# Patient Record
Sex: Female | Born: 1965 | Race: White | Hispanic: No | Marital: Married | Smoking: Never smoker
Health system: Southern US, Community
[De-identification: ages and names within clinical notes are randomized; demographics above are authoritative.]

## PROBLEM LIST (undated history)

## (undated) DIAGNOSIS — C439 Malignant melanoma of skin, unspecified: Secondary | ICD-10-CM

## (undated) DIAGNOSIS — N200 Calculus of kidney: Secondary | ICD-10-CM

## (undated) DIAGNOSIS — E785 Hyperlipidemia, unspecified: Secondary | ICD-10-CM

## (undated) DIAGNOSIS — Z87442 Personal history of urinary calculi: Secondary | ICD-10-CM

## (undated) DIAGNOSIS — F32A Depression, unspecified: Secondary | ICD-10-CM

## (undated) DIAGNOSIS — I10 Essential (primary) hypertension: Secondary | ICD-10-CM

## (undated) DIAGNOSIS — K760 Fatty (change of) liver, not elsewhere classified: Secondary | ICD-10-CM

## (undated) DIAGNOSIS — G4730 Sleep apnea, unspecified: Secondary | ICD-10-CM

## (undated) DIAGNOSIS — K219 Gastro-esophageal reflux disease without esophagitis: Secondary | ICD-10-CM

## (undated) DIAGNOSIS — N201 Calculus of ureter: Secondary | ICD-10-CM

## (undated) DIAGNOSIS — T7840XA Allergy, unspecified, initial encounter: Secondary | ICD-10-CM

## (undated) DIAGNOSIS — J189 Pneumonia, unspecified organism: Secondary | ICD-10-CM

## (undated) DIAGNOSIS — J45909 Unspecified asthma, uncomplicated: Secondary | ICD-10-CM

## (undated) DIAGNOSIS — F329 Major depressive disorder, single episode, unspecified: Secondary | ICD-10-CM

## (undated) HISTORY — PX: OTHER SURGICAL HISTORY: SHX169

## (undated) HISTORY — DX: Calculus of ureter: N20.1

## (undated) HISTORY — DX: Gastro-esophageal reflux disease without esophagitis: K21.9

## (undated) HISTORY — DX: Hyperlipidemia, unspecified: E78.5

## (undated) HISTORY — DX: Allergy, unspecified, initial encounter: T78.40XA

## (undated) HISTORY — DX: Fatty (change of) liver, not elsewhere classified: K76.0

## (undated) HISTORY — DX: Depression, unspecified: F32.A

## (undated) HISTORY — PX: DILATION AND CURETTAGE OF UTERUS: SHX78

## (undated) HISTORY — PX: TUBAL LIGATION: SHX77

## (undated) HISTORY — PX: ESOPHAGOGASTRODUODENOSCOPY: SHX1529

## (undated) HISTORY — DX: Malignant melanoma of skin, unspecified: C43.9

## (undated) HISTORY — DX: Essential (primary) hypertension: I10

## (undated) HISTORY — DX: Calculus of kidney: N20.0

## (undated) HISTORY — DX: Major depressive disorder, single episode, unspecified: F32.9

## (undated) HISTORY — PX: COLONOSCOPY: SHX174

## (undated) HISTORY — DX: Sleep apnea, unspecified: G47.30

## (undated) HISTORY — PX: ENDOMETRIAL ABLATION W/ NOVASURE: SUR434

---

## 1998-08-31 ENCOUNTER — Other Ambulatory Visit: Admission: RE | Admit: 1998-08-31 | Discharge: 1998-08-31 | Payer: Self-pay | Admitting: Obstetrics and Gynecology

## 1998-08-31 ENCOUNTER — Encounter (INDEPENDENT_AMBULATORY_CARE_PROVIDER_SITE_OTHER): Payer: Self-pay | Admitting: Specialist

## 1998-12-15 ENCOUNTER — Other Ambulatory Visit: Admission: RE | Admit: 1998-12-15 | Discharge: 1998-12-15 | Payer: Self-pay | Admitting: Obstetrics and Gynecology

## 1999-03-11 ENCOUNTER — Other Ambulatory Visit: Admission: RE | Admit: 1999-03-11 | Discharge: 1999-03-11 | Payer: Self-pay | Admitting: Obstetrics and Gynecology

## 1999-07-12 ENCOUNTER — Other Ambulatory Visit: Admission: RE | Admit: 1999-07-12 | Discharge: 1999-07-12 | Payer: Self-pay | Admitting: Obstetrics and Gynecology

## 2000-01-12 ENCOUNTER — Other Ambulatory Visit: Admission: RE | Admit: 2000-01-12 | Discharge: 2000-01-12 | Payer: Self-pay | Admitting: Obstetrics and Gynecology

## 2000-08-03 ENCOUNTER — Other Ambulatory Visit: Admission: RE | Admit: 2000-08-03 | Discharge: 2000-08-03 | Payer: Self-pay | Admitting: Obstetrics and Gynecology

## 2000-08-15 ENCOUNTER — Encounter: Admission: RE | Admit: 2000-08-15 | Discharge: 2000-08-15 | Payer: Self-pay | Admitting: Obstetrics and Gynecology

## 2000-08-15 ENCOUNTER — Encounter: Payer: Self-pay | Admitting: Obstetrics and Gynecology

## 2001-08-07 ENCOUNTER — Other Ambulatory Visit: Admission: RE | Admit: 2001-08-07 | Discharge: 2001-08-07 | Payer: Self-pay | Admitting: Obstetrics and Gynecology

## 2001-08-28 ENCOUNTER — Encounter: Payer: Self-pay | Admitting: Obstetrics and Gynecology

## 2001-08-28 ENCOUNTER — Encounter: Admission: RE | Admit: 2001-08-28 | Discharge: 2001-08-28 | Payer: Self-pay | Admitting: Obstetrics and Gynecology

## 2002-09-02 ENCOUNTER — Encounter: Payer: Self-pay | Admitting: Obstetrics and Gynecology

## 2002-09-02 ENCOUNTER — Encounter: Admission: RE | Admit: 2002-09-02 | Discharge: 2002-09-02 | Payer: Self-pay | Admitting: Obstetrics and Gynecology

## 2003-08-13 ENCOUNTER — Ambulatory Visit (HOSPITAL_COMMUNITY): Admission: RE | Admit: 2003-08-13 | Discharge: 2003-08-13 | Payer: Self-pay | Admitting: Obstetrics and Gynecology

## 2003-08-13 ENCOUNTER — Encounter (INDEPENDENT_AMBULATORY_CARE_PROVIDER_SITE_OTHER): Payer: Self-pay | Admitting: *Deleted

## 2003-10-29 ENCOUNTER — Encounter: Admission: RE | Admit: 2003-10-29 | Discharge: 2003-10-29 | Payer: Self-pay | Admitting: Obstetrics and Gynecology

## 2005-01-16 HISTORY — PX: LAPAROSCOPIC HYSTERECTOMY: SHX1926

## 2005-05-24 ENCOUNTER — Ambulatory Visit (HOSPITAL_COMMUNITY): Admission: RE | Admit: 2005-05-24 | Discharge: 2005-05-24 | Payer: Self-pay | Admitting: Obstetrics and Gynecology

## 2006-05-28 ENCOUNTER — Encounter: Admission: RE | Admit: 2006-05-28 | Discharge: 2006-05-28 | Payer: Self-pay | Admitting: Obstetrics and Gynecology

## 2006-08-09 ENCOUNTER — Ambulatory Visit (HOSPITAL_BASED_OUTPATIENT_CLINIC_OR_DEPARTMENT_OTHER): Admission: RE | Admit: 2006-08-09 | Discharge: 2006-08-09 | Payer: Self-pay | Admitting: Urology

## 2007-04-04 ENCOUNTER — Ambulatory Visit: Payer: Self-pay | Admitting: Nurse Practitioner

## 2007-05-17 ENCOUNTER — Ambulatory Visit: Payer: Self-pay | Admitting: Nurse Practitioner

## 2007-05-24 ENCOUNTER — Ambulatory Visit (HOSPITAL_COMMUNITY): Admission: RE | Admit: 2007-05-24 | Discharge: 2007-05-25 | Payer: Self-pay | Admitting: Obstetrics and Gynecology

## 2007-05-24 ENCOUNTER — Encounter (INDEPENDENT_AMBULATORY_CARE_PROVIDER_SITE_OTHER): Payer: Self-pay | Admitting: Obstetrics and Gynecology

## 2007-05-30 ENCOUNTER — Inpatient Hospital Stay (HOSPITAL_COMMUNITY): Admission: AD | Admit: 2007-05-30 | Discharge: 2007-05-30 | Payer: Self-pay | Admitting: Obstetrics

## 2007-06-06 ENCOUNTER — Encounter: Admission: RE | Admit: 2007-06-06 | Discharge: 2007-06-06 | Payer: Self-pay | Admitting: Obstetrics and Gynecology

## 2007-06-13 ENCOUNTER — Encounter: Admission: RE | Admit: 2007-06-13 | Discharge: 2007-06-13 | Payer: Self-pay | Admitting: Obstetrics and Gynecology

## 2007-06-17 HISTORY — PX: BREAST BIOPSY: SHX20

## 2007-11-28 ENCOUNTER — Encounter: Admission: RE | Admit: 2007-11-28 | Discharge: 2007-11-28 | Payer: Self-pay | Admitting: Obstetrics and Gynecology

## 2008-06-08 ENCOUNTER — Encounter: Admission: RE | Admit: 2008-06-08 | Discharge: 2008-06-08 | Payer: Self-pay | Admitting: Obstetrics and Gynecology

## 2009-05-03 ENCOUNTER — Emergency Department (HOSPITAL_COMMUNITY): Admission: EM | Admit: 2009-05-03 | Discharge: 2009-05-03 | Payer: Self-pay | Admitting: Emergency Medicine

## 2009-05-03 DIAGNOSIS — N201 Calculus of ureter: Secondary | ICD-10-CM

## 2009-05-03 DIAGNOSIS — K760 Fatty (change of) liver, not elsewhere classified: Secondary | ICD-10-CM

## 2009-05-03 HISTORY — DX: Fatty (change of) liver, not elsewhere classified: K76.0

## 2009-05-03 HISTORY — DX: Calculus of ureter: N20.1

## 2009-06-11 ENCOUNTER — Encounter: Admission: RE | Admit: 2009-06-11 | Discharge: 2009-06-11 | Payer: Self-pay | Admitting: Obstetrics and Gynecology

## 2010-02-06 ENCOUNTER — Encounter: Payer: Self-pay | Admitting: Obstetrics and Gynecology

## 2010-04-05 LAB — URINALYSIS, ROUTINE W REFLEX MICROSCOPIC
Bilirubin Urine: NEGATIVE
Glucose, UA: NEGATIVE mg/dL
Ketones, ur: NEGATIVE mg/dL
Leukocytes, UA: NEGATIVE
Nitrite: NEGATIVE
Protein, ur: 30 mg/dL — AB
Specific Gravity, Urine: 1.026 (ref 1.005–1.030)
Urobilinogen, UA: 0.2 mg/dL (ref 0.0–1.0)
pH: 6 (ref 5.0–8.0)

## 2010-04-05 LAB — URINE MICROSCOPIC-ADD ON

## 2010-05-02 ENCOUNTER — Other Ambulatory Visit: Payer: Self-pay | Admitting: Obstetrics and Gynecology

## 2010-05-02 DIAGNOSIS — Z1231 Encounter for screening mammogram for malignant neoplasm of breast: Secondary | ICD-10-CM

## 2010-05-31 NOTE — Op Note (Signed)
NAMEMEGANNE, RITA             ACCOUNT NO.:  [id number]   MEDICAL RECORD NO.:  [id number]         PATIENT TYPE:  WOIB   LOCATION:                                FACILITY:  [facility]   PHYSICIAN:  Lenoard Aden, M.D.DATE OF BIRTH:  December 29, 1965   DATE OF PROCEDURE:  DATE OF DISCHARGE:  05/25/2007                               OPERATIVE REPORT   PREOPERATIVE DIAGNOSES:  Refractory dysmenorrhea and menorrhagia.   POSTOPERATIVE DIAGNOSES:  Refractory dysmenorrhea, menorrhagia,  bilateral hydrosalpinx, and enterocele.   PROCEDURES:  Diagnostic laparoscopy, total laparoscopic hysterectomy,  lysis of adhesions, bilateral salpingectomy, and McCall culdoplasty.   SURGEON:  Lenoard Aden, MD.   ASSISTANTMarland Kitchen  Alphonsus Sias. Ernestina Penna, MD.   ANESTHESIA:  General.   ESTIMATED BLOOD LOSS:  100 mL.   COMPLICATIONS:  None.   DRAINS:  Foley.   COUNTS:  Correct.   The patient was taken to recovery in good condition.   SPECIMEN:  Uterus, cervix, and bilateral tubes.   BRIEF OPERATIVE NOTE:  Being apprised of risks of anesthesia, infection,  bleeding, injury to abdominal organs, need for repair, delayed versus  immediate complications to include bowel and bladder injury, the patient  was brought to the operating room where she was administered general  anesthetic without complications, prepped and draped in the usual  sterile fashion.  A Foley catheter was placed.  The feet were placed in  the Yellofin stirrups.  Exam under anesthesia reveals a bulky anteflexed  uterus and no adnexal masses.  A weighted speculum was placed, and the  RUMI retractor was placed after dilating the cervix due to previous  endometrial ablation with minimal difficulty.  At this time, the RUMI  was secured.  The occluder was secured.  Infraumbilical incision made  with a scalpel.  Veress needle placed.  Opening pressure was noted, 3 L  of CO2 was insufflated without difficulty.  Trocar placed.  Visualization reveals  omental adhesions to the anterior abdominal wall  periumbilically.  No evidence of bowel injury noted.  At this time, two  5-mm sites were made in the bilateral right and left lower quadrants  under direct visualization and transillumination, 5-mm trocar sites were  placed x2.  The visualization reveals a normal uterus, some posterior  cul-de-sac peritubular adhesions, normal right and normal left ovary,  and normal anterior cul-de-sac.  The left tube was traced out to the  fimbriated end, previously been divided.  Hydrosalpinx was identified  along with the fimbriated portion.  The mesosalpinx was cauterized and  divided using the Gyrus down to the level of the uterine cornua, and the  partial part of the tube was placed in the cul-de-sac.  Same procedure  was done on the right tube, and the right tube was also placed in the  cul-de-sac.  Both ureters were noted to be peristalsing normally  bilaterally.  Both ovaries appear normal.  Infundibulopelvic ligaments  were previously noted.  The round ligaments were bilaterally ligated and  divided.  The bladder flap was developed using the Gyrus, and the  uterine vessels were skeletonized after progressive bites down  the tubo-  ovarian round ligament complex.  The right uterine vessels were  bilaterally divided using the Gyrus, and the scalpel was then used to  score along the cervicovaginal junction identifying the RUMI cupped  process.  Please note that the adhesions in the right tube and the right  ovary were also sharply lysed upon the removal of the right ovary and  cul-de-sac adhesions were lysed as well in order to prevent cul-de-sac  injury.  At this time, the specimen was removed into the vagina and  retracted without difficulty.  The vagina was closed front to back using  interrupted 0 PDS sutures.  McCall culdoplasty suture x1 internally was  placed using the 0 PDS as well.  Ureters were then reidentified  bilaterally and found to be  peristalsing normally.  The irrigation was  accomplished.  CO2 was released.  Good hemostasis was noted.  At this  time, good hemostasis was noted.  Pictures were taken.  The scope was  removed from the umbilical port and placed into the left lower quadrant  port where a 10-mm trocar has replaced the 5-cm trocar and the  periumbilical port was removed noting that the omental adhesions were  free from any bleeding and that there was no evidence of any bowel  injury in the periumbilical port.  At this time, all instruments were  removed under direct visualization, CO2 having been released.  Incision  was closed using 0-Vicryl and Dermabond.  The patient tolerated the  procedure well, was awakened and transferred to the recovery in good  condition.      Lenoard Aden, M.D.  Electronically Signed     RJT/MEDQ  D:  05/24/2007  T:  05/25/2007  Job:  [id number]

## 2010-05-31 NOTE — Op Note (Signed)
Audrey Hurley, Audrey Hurley             ACCOUNT NO.:  [id number]   MEDICAL RECORD NO.:  [id number]          PATIENT TYPE:  AMB   LOCATION:  [facility]                         FACILITY:  [facility]   PHYSICIAN:  Martina Sinner, MD DATE OF BIRTH:  [birth year]   DATE OF PROCEDURE:  08/09/2006  DATE OF DISCHARGE:                               OPERATIVE REPORT   PREOPERATIVE DIAGNOSIS:  Stress urinary incontinence.   POSTOPERATIVE DIAGNOSIS:  Stress urinary incontinence.   SURGERY:  Sling cystourethropexy [facility]) plus cystoscopy.   Audrey Hurley has stress urinary incontinence.  She failed pelvic  floor exercises.  Today, she underwent a sling cystourethropexy.  I  initially placed the patient in the lithotomy position.  I took extra  care to minimize the risk of compartment syndrome, neuropathy, and DVT.  Her preoperative laboratory tests were normal.  She was given  preoperative antibiotics.  She is allergic Cipro and sulfa.  She did  have a high grade II cystocele that I do not plan on repairing.  It was  easy to identify the urethra and the urethrovesical angle.  I made two 1  cm incisions, 1.5 cm lateral to the midline and 1 fingerbreadth above  the symphysis pubis.  I then made a 2.5 cm incision overlying the mid  urethra.  I instilled epinephrine and lidocaine underneath the mucosa to  allow for a thick pubocervical flap.  I also injected 3 mL close to the  urethrovesical angle.   With sharp dissection, I dissected to urethrovesical angle bilaterally  with Metzenbaum scissors.  There was little to no bleeding.  I could  identify the urethrovesical angle and the catheter with its Foley.  With  the bladder emptied, I passed a SPARC needle on top of and along the  back of the symphysis pubis parallel to the midline onto the pulp of my  left index finger.  This was done bilaterally.  The passage went very  nicely.   I then cystoscoped the patient.  There is no injury to the bladder or  urethra.  There was no indentation of the bladder with movement of the  needle.  I did look on a number of occasions throughout the case, and  again, the bladder and urethra looked good.  There was delayed efflux of  indigo carmine from both ureteral orifices but finally, after a second  dose of indigo carmine and 10 mg IV Lasix, she had a good diuresis with  efflux of indigo carmine with good jets bilaterally.  I actually waited  for nearly 20 minutes and she really was not making much urine, though  her blood pressure was good, but again finally she diuresed fine.   With the bladder emptied, I attached the SPARC sling and brought it up  with its sheath through the retropubic space.  I placed it over the mid  urethra.  I tensioned it over the fat part of a mid sized Kelly clamp.  I cut below the blue dots and removed the sheaths.  I was very happy  with the position of the sling and its tension with  hypermobility in the  midline and also laterally.  I then closed the incision with running 2-0  Vicryl on a CT1 followed by three interrupted sutures to help strengthen  the closure.   Surprisingly, the patient started having a little bit of bleeding from  the right abdominal incision.  It was almost as if the patient had a  brisk nosebleed.  It was odd since it was very delayed occurring several  minutes after I had removed the sheath.  There was no hematoma,  swelling, or labial hematoma.  I applied pressure for two minutes and it  medially stopped.  I was waiting for the Lasix and indigo carmine, I  kept pressure on the area for 7-8 minutes.  I then inspected the area  for 7-8 minutes at the end of the case and there was no bleeding,  swelling, or hematoma.   The sling was cut below the skin.  The skin was closed with 4-0  subcuticular followed by Dermabond.  The Foley catheter was draining  well at the of the case blue urine.  A vaginal pack was inserted.  The  catheter was actually  plugged with a catheter plug so we could give the  patient a trial of voiding in recovery.  The procedure went very well  and hopefully it will reach Audrey Hurley's treatment goal.           ______________________________  Martina Sinner, MD  Electronically Signed     SAM/MEDQ  D:  08/09/2006  T:  08/09/2006  Job:  [id number]

## 2010-05-31 NOTE — H&P (Signed)
Audrey Hurley, Audrey Hurley             ACCOUNT NO.:  [id number]   MEDICAL RECORD NO.:  [id number]          PATIENT TYPE:  AMB   LOCATION:  [facility]                           FACILITY:  [facility]   PHYSICIAN:  Lenoard Aden, M.D.DATE OF BIRTH:  [birth year]   DATE OF ADMISSION:  05/24/2007  DATE OF DISCHARGE:                              HISTORY & PHYSICAL   CHIEF COMPLAINT:  Dysmenorrhea, menorrhagia, refractory.   HISTORY OF PRESENT ILLNESS:  She is a 45 year old white female G2, P2,  with history of C-section x1, history of VBAC x1, history of tubal  ligation and NovaSure endometrial ablation, who presents with persistent  menometrorrhagia and dysmenorrhea for definitive therapy.  She is a  nonsmoker, nondrinker.  She denies domestic or physical violence.   MEDICATIONS:  Pindolol for hypertension, fish oil, ranitidine for  reflux, gemfibrozil, Naprosyn as needed, Prilosec, Allegra, Flonase,  calcium and D, lipid and Effexor.   MEDICAL PROBLEMS:  1. Dysmenorrhea as noted.  2. Menorrhagia.  3. Allergic rhinitis.  4. Depression.  5. Hypertension.  6. Hypercholesterolemia.   SURGICAL HISTORY:  Remarkable for  1. C-section.  2. Vaginal delivery.  3. Tubal ligation.  4. NovaSure endometrial ablation.   FAMILY HISTORY:  Remarkable for heart disease, hypertension, breast  cancer and prostate cancer.   PHYSICAL EXAMINATION:  GENERAL APPEARANCE:  She is a well-developed,  well-nourished white female.  VITAL SIGNS:  Height of 65 inches.  Weight of 207 pounds.  HEENT:  Normal.  LUNGS:  Clear.  HEART:  Regular rhythm.  ABDOMEN:  Soft, nontender. No CVA tenderness is noted.  SKIN:  Intact.  Pelvic:  Exam reveals the uterus to be normal size, shape and contour,  anteflexed.  No adnexal masses were appreciated.  Diffuse tenderness is  noted.  EXTREMITIES:  No cords.  NEUROLOGIC:  Exam was nonfocal.   IMPRESSION:  Refractory dysmenorrhea, menorrhagia, status post NovaSure  and tubal  ligation.   PLAN:  Proceed with definitive therapy in the form of a probable total  laparoscopic hysterectomy, possible LAVH, possible PAH, bilateral  salpingectomy, possible LSO due to persistent left-sided pain.  Risks of  anesthesia, infection, bleeding, injury to abdominal organs with  need  for repair is discussed, delayed versus immediate complications to  include bowel and bladder injury noted, inability to cure pelvic pain  discussed.  The patient acknowledges and we will proceed.      Lenoard Aden, M.D.  Electronically Signed     RJT/MEDQ  D:  05/24/2007  T:  05/24/2007  Job:  [id number]

## 2010-06-03 NOTE — H&P (Signed)
Audrey Hurley, Audrey Hurley                         ACCOUNT NO.:  [id number]   MEDICAL RECORD NO.:  [id number]                   PATIENT TYPE:  AMB   LOCATION:  [facility]                                  FACILITY:  [facility]   PHYSICIAN:  Lenoard Aden, M.D.             DATE OF BIRTH:  [birth year]   DATE OF ADMISSION:  08/13/2003  DATE OF DISCHARGE:                                HISTORY & PHYSICAL   CHIEF COMPLAINT:  Persistent menorrhagia, dysmenorrhea for definitive  therapy.   HISTORY OF PRESENT ILLNESS:  A 45 year old white female, G2, P2, status post  tubal ligation who presents for definitive therapy with a history of normal  TSH prolactin, history of normal saline sonohysterography.   MEDICATIONS:  Prevacid, Allegra and Effexor.   ALLERGIES:  ASPIRIN, SULFA, CIPRO.   PAST MEDICAL HISTORY:  She has a history of one uncomplicated cesarean  section, one uncomplicated VBAC and a tubal ligation.   FAMILY HISTORY:  Noncontributory.   SOCIAL HISTORY:  Noncontributory.   PHYSICAL EXAMINATION:  GENERAL:  She is a well-developed, well-nourished,  white female in no acute distress.  HEENT:  Normal.  LUNGS:  Clear.  HEART:  Regular rhythm.  ABDOMEN:  Soft and nontender.  PELVIC:  Reveals an anteflexed uterus and no adnexal masses.   IMPRESSION:  Persistent menorrhagia, dysmenorrhea for definitive therapy.  The patient declined Jearld Adjutant insertion, declines hysterectomy, will proceed  with Novasure endometrial ablation. The risks of anesthesia, infection,  bleeding, injury to abdominal organs and need for repair were discussed,  delayed versus immediate complications to include bowel burns and possible  bladder injury. The patient acknowledges and wishes to proceed.                                               Lenoard Aden, M.D.    RJT/MEDQ  D:  08/12/2003  T:  08/12/2003  Job:  [id number]

## 2010-06-03 NOTE — Op Note (Signed)
Audrey Hurley, Audrey Hurley                         ACCOUNT NO.:  [id number]   MEDICAL RECORD NO.:  [id number]                   PATIENT TYPE:  AMB   LOCATION:  [facility]                                  [facility]ACILITY:  [facility]   PHYSICIAN:  Lenoard Aden, M.D.             DATE OF BIRTH:  1965-08-18   DATE OF PROCEDURE:  08/13/2003  DATE OF DISCHARGE:                                 OPERATIVE REPORT   PREOPERATIVE DIAGNOSIS:  Secondary menorrhagia, refractory; secondary  dysmenorrhea, refractory.   POSTOPERATIVE DIAGNOSIS:  Secondary menorrhagia, refractory; secondary  dysmenorrhea, refractory.   OPERATION PERFORMED:  Diagnostic hysteroscopy, dilation and curettage,  NovaSure endometrial ablation.   SURGEON:  Lenoard Aden, M.D.   ANESTHESIA:  General.   ESTIMATED BLOOD LOSS:  Less than 50 mL.   COMPLICATIONS:  None.   DRAINS:  None.   COUNTS:  Correct.   DISPOSITION:  Patient to recovery in good condition.   DESCRIPTION OF PROCEDURE:  After being apprised of the risks of anesthesia,  infection, bleeding, possible uterine perforation, need for repair, patient  was brought to the operating room where she was administered a general  anesthetic without complications, prepped and draped in the usual sterile  fashion and catheterized until the bladder was empty.  Examination under  anesthesia reveals mid anteflexed uterus and no adnexal masses.  Uterus  intracervical length  of 2.5 cm is sounded and uterine cavity length of 9 cm  is noted.  At this point cervix is dilated up to a #25 Pratt dilator.  Hysteroscope placed revealing a thickened endometrium, bilateral normal  tubal ostia, no focal lesions, D&C for a copious amount of endometrial  curettings was performed.  NovaSure device was then placed after  revisualization of an otherwise normal cavity.  The cavity width was set at  4.5 after seating the device in proper fashion and attaching the gray seal  as previously noted.  CO2 check  was passed immediately.  The procedure was  initiated lasting 67 seconds to a power of 161.  The procedure was  terminated, device was removed in the standard fashion.  Revisualization of  the cavity revealed no evidence of perforation and a well ablated cavity.  At this time good hemostasis was noted.  Instruments were removed.  The  patient tolerated the procedure well and returned to recovery room in good  condition.                                               Lenoard Aden, M.D.    RJT/MEDQ  D:  08/13/2003  T:  08/13/2003  Job:  [id number]

## 2010-06-15 ENCOUNTER — Ambulatory Visit
Admission: RE | Admit: 2010-06-15 | Discharge: 2010-06-15 | Disposition: A | Discharge status: Home / Self Care | Payer: BC Managed Care – PPO | Source: Ambulatory Visit | Attending: Obstetrics and Gynecology | Admitting: Obstetrics and Gynecology

## 2010-06-15 DIAGNOSIS — Z1231 Encounter for screening mammogram for malignant neoplasm of breast: Secondary | ICD-10-CM

## 2010-06-22 ENCOUNTER — Other Ambulatory Visit: Payer: Self-pay | Admitting: Obstetrics and Gynecology

## 2010-06-22 DIAGNOSIS — R928 Other abnormal and inconclusive findings on diagnostic imaging of breast: Secondary | ICD-10-CM

## 2010-06-23 ENCOUNTER — Ambulatory Visit
Admission: RE | Admit: 2010-06-23 | Discharge: 2010-06-23 | Disposition: A | Discharge status: Home / Self Care | Payer: BC Managed Care – PPO | Source: Ambulatory Visit | Attending: Obstetrics and Gynecology | Admitting: Obstetrics and Gynecology

## 2010-06-23 DIAGNOSIS — R928 Other abnormal and inconclusive findings on diagnostic imaging of breast: Secondary | ICD-10-CM

## 2010-06-29 ENCOUNTER — Other Ambulatory Visit: Payer: Self-pay | Admitting: Obstetrics and Gynecology

## 2010-07-22 ENCOUNTER — Encounter: Payer: Self-pay | Admitting: Internal Medicine

## 2010-07-28 ENCOUNTER — Encounter: Payer: Self-pay | Admitting: Internal Medicine

## 2010-07-28 ENCOUNTER — Ambulatory Visit (INDEPENDENT_AMBULATORY_CARE_PROVIDER_SITE_OTHER): Payer: BC Managed Care – PPO | Admitting: Internal Medicine

## 2010-07-28 VITALS — BP 120/84 | HR 60 | Ht 66.0 in | Wt 210.0 lb

## 2010-07-28 DIAGNOSIS — K648 Other hemorrhoids: Secondary | ICD-10-CM

## 2010-07-28 DIAGNOSIS — K625 Hemorrhage of anus and rectum: Secondary | ICD-10-CM

## 2010-07-28 DIAGNOSIS — K219 Gastro-esophageal reflux disease without esophagitis: Secondary | ICD-10-CM

## 2010-07-28 MED ORDER — PSYLLIUM 28 % PO PACK: PACK | ORAL | Status: DC

## 2010-07-28 MED ORDER — OMEPRAZOLE 20 MG PO CPDR: 20.0000 mg | DELAYED_RELEASE_CAPSULE | Freq: Two times a day (BID) | ORAL | Status: DC

## 2010-07-28 MED ORDER — HYDROCORTISONE ACETATE 25 MG RE SUPP: RECTAL | Status: DC

## 2010-07-28 NOTE — Patient Instructions (Signed)
We have sent the following medications to your pharmacy for you to pick up at your convenience: Anusol HC suppositories- Insert 1 suppository into the rectum every night. Omeprazole 20 mg. Increase to 1 tablet by mouth TWICE daily. Purchase Metamucil over the counter. Take 1 packet dissolved in water/juice once daily. CC: Ninfa Linden, NP

## 2010-07-28 NOTE — Progress Notes (Signed)
Audrey Hurley November 19, 1965 MRN [id number]    History of Present Illness:  This is a 45 year old white female with increasing symptoms of gastroesophageal reflux, constipation, and rectal bleeding which occurrs several times a week. It is associated with rectal pain and a sharp sensation in the rectum before having a bowel movement. She has used over-the-counter preparation H for external use only. She was initially on Colace 3 times a day having 3 bowel movements a day but most recently has cut back to one a day. However, she has currently increased it to 2 a day and as a result of it she has a bowel movement every other day. She still feels that she is constipated. There is a history of a gallbladder polyp with several abdominal ultrasounds. She denies any food intolerance, nausea or vomiting. There is no family history of colon cancer. A colonoscopy in 2002 was essentially normal. An upper endoscopy at that time showed gastritis. This was done by Dr.Patel in [place].    Past Medical History  Diagnosis Date  . Fatty liver 05/03/09    seen on CT  . Ureteral calculus 05/03/09  . Depression   . Hypertension   . Hyperlipidemia   . Melanoma   . Sleep apnea    Past Surgical History  Procedure Date  . Tubal ligation   . Endometrial ablation w/ novasure   . Cesarean section 1992  . Dilation and curettage of uterus   . Sling cystourethropexy   . Laparoscopic hysterectomy 2007    with lysis of adhesions    reports that she has never smoked. She has never used smokeless tobacco. She reports that she drinks alcohol. She reports that she does not use illicit drugs. family history includes Breast cancer in her maternal aunt; Colon polyps in an unspecified family member; Heart disease in an unspecified family member; Hypertension in an unspecified family member; and Prostate cancer in an unspecified family member. Allergies  Allergen Reactions  . Aspirin   . Ciprocin-Fluocin-Procin (Fluocinolone  Acetonide)   . Sulfa Antibiotics         Review of Systems:Denies dysphagia, odynophagia, chest pain or shortness of breath  The remainder of the 10  point ROS is negative except as outlined in H&P   Physical Exam: General appearance  Well developed, in no distress. Eyes- non icteric. HEENT nontraumatic, normocephalic. Mouth no lesions, tongue papillated, no cheilosis. Neck supple without adenopathy, thyroid not enlarged, no carotid bruits, no JVD. Lungs Clear to auscultation bilaterally. Cor normal S1 normal S2, regular rhythm , no murmur,  quiet precordium. Abdomen Soft, nontender with normal active bowel sounds. No distention. Liver edge at costal margin. No scars. Rectal:Anoscopic exam reveals small external hemorrhoids. Normal rectal sphincter, a few internal hemorrhoids, bluish and edematous. No active bleeding. Stool Hemoccult is negative.  Extremities no pedal edema. Skin no lesions. Neurological alert and oriented x 3. Psychological normal mood and affect.  Assessment and Plan:  Problem #1 rectal bleeding. This is most likely from first-degree hemorrhoids. We will start her on Anusol-HC suppositories each bedtime and have discussed normalizing her bowel habits. She received samples of Metamucil packets to take one every day to improve the bowel habits. She will also take a probiotic daily. She will need a colonoscopy at age 77 or before that if bleeding continues.  Problem #2 gastroesophageal reflux. This may be due to weight gain, or psychotropic medications, specifically Celexa. Relafen may be contributing as well. We will increase omeprazole to 20 mg  by mouth twice a day. She will hold her ranitidine. If symptoms continue, we will consider a repeat upper endoscopy.    07/28/2010 Audrey Hurley

## 2010-08-08 ENCOUNTER — Ambulatory Visit: Payer: BC Managed Care – PPO | Admitting: Internal Medicine

## 2010-10-31 LAB — CBC
HCT: 39.1
Hemoglobin: 13.5
MCHC: 34.4
MCV: 88.8
Platelets: 260
RBC: 4.4
RDW: 12.6
WBC: 6.4

## 2010-10-31 LAB — PROTIME-INR
INR: 1
Prothrombin Time: 13.6

## 2010-10-31 LAB — TYPE AND SCREEN
ABO/RH(D): O POS
Antibody Screen: NEGATIVE

## 2010-10-31 LAB — ABO/RH: ABO/RH(D): O POS

## 2010-10-31 LAB — APTT: aPTT: 32

## 2010-11-14 ENCOUNTER — Other Ambulatory Visit: Payer: Self-pay | Admitting: Internal Medicine

## 2011-01-20 ENCOUNTER — Ambulatory Visit: Payer: Self-pay | Admitting: Nurse Practitioner

## 2011-05-16 ENCOUNTER — Other Ambulatory Visit: Payer: Self-pay | Admitting: Obstetrics and Gynecology

## 2011-05-16 DIAGNOSIS — Z1231 Encounter for screening mammogram for malignant neoplasm of breast: Secondary | ICD-10-CM

## 2011-06-14 ENCOUNTER — Encounter: Payer: Self-pay | Admitting: Internal Medicine

## 2011-06-14 ENCOUNTER — Telehealth: Payer: Self-pay | Admitting: *Deleted

## 2011-06-14 DIAGNOSIS — K625 Hemorrhage of anus and rectum: Secondary | ICD-10-CM

## 2011-06-14 MED ORDER — HYDROCORTISONE ACETATE 25 MG RE SUPP: RECTAL | Status: DC

## 2011-06-14 NOTE — Telephone Encounter (Signed)
Message copied by Richardson Chiquito on Wed Jun 14, 2011  9:17 AM ------      Message from: Hart Carwin      Created: Wed Jun 14, 2011  9:08 AM       Last colon 2002, if she continues to bleed, then she ought to have a colonoscopy. If it is just pain, then I would refill her Anusol HC supp.      ----- Message -----         From: Richardson Chiquito, CMA         Sent: 06/14/2011   8:30 AM           To: Hart Carwin, MD            Dr Juanda Chance-      Patient requests refills on Anusol suppositories. We last saw her 07-2010 at which time we gave her the suppositories. Do you want me to continue refilling or does she need colonoscopy (possibility of this indicated in your office note)?

## 2011-06-14 NOTE — Telephone Encounter (Signed)
Patient states that she does have intermittent bleeding as well as some change in bowel habits. She has scheduled a colonoscopy for 06/28/11 and I will go ahead and send anusol suppositories to her pharmacy as well.

## 2011-06-15 ENCOUNTER — Telehealth: Payer: Self-pay | Admitting: Internal Medicine

## 2011-06-15 MED ORDER — PEG-KCL-NACL-NASULF-NA ASC-C 100 G PO SOLR: 1.0000 | Freq: Once | ORAL | Status: DC

## 2011-06-15 NOTE — Telephone Encounter (Signed)
rx sent

## 2011-06-23 ENCOUNTER — Ambulatory Visit: Payer: BC Managed Care – PPO

## 2011-06-23 ENCOUNTER — Ambulatory Visit
Admission: RE | Admit: 2011-06-23 | Discharge: 2011-06-23 | Disposition: A | Discharge status: Home / Self Care | Payer: BC Managed Care – PPO | Source: Ambulatory Visit | Attending: Obstetrics and Gynecology | Admitting: Obstetrics and Gynecology

## 2011-06-23 DIAGNOSIS — Z1231 Encounter for screening mammogram for malignant neoplasm of breast: Secondary | ICD-10-CM

## 2011-06-28 ENCOUNTER — Encounter: Payer: Self-pay | Admitting: Internal Medicine

## 2011-06-28 ENCOUNTER — Ambulatory Visit (AMBULATORY_SURGERY_CENTER): Payer: BC Managed Care – PPO | Admitting: Internal Medicine

## 2011-06-28 VITALS — BP 113/82 | HR 72 | Temp 98.1°F | Resp 18 | Ht 66.0 in | Wt 210.0 lb

## 2011-06-28 DIAGNOSIS — K625 Hemorrhage of anus and rectum: Secondary | ICD-10-CM

## 2011-06-28 DIAGNOSIS — Z1211 Encounter for screening for malignant neoplasm of colon: Secondary | ICD-10-CM

## 2011-06-28 MED ORDER — HYDROCORTISONE ACETATE 25 MG RE SUPP: 25.0000 mg | Freq: Every evening | RECTAL | Status: AC | PRN

## 2011-06-28 MED ORDER — SODIUM CHLORIDE 0.9 % IV SOLN: 500.0000 mL | INTRAVENOUS | Status: DC

## 2011-06-28 MED ORDER — POLYETHYLENE GLYCOL 3350 17 GM/SCOOP PO POWD: 17.0000 g | Freq: Every day | ORAL | Status: AC

## 2011-06-28 NOTE — Patient Instructions (Addendum)

## 2011-06-28 NOTE — Op Note (Signed)
[facility] [address] [place], [place]  [address]  COLONOSCOPY PROCEDURE REPORT  PATIENT:  Audrey Hurley, Audrey Hurley  MR#:  [id number] BIRTHDATE:  12-02-1965, 46 yrs. old  GENDER:  female ENDOSCOPIST:  Hedwig Morton. Juanda Chance, MD REF. BY:  Ninfa Linden, NP PROCEDURE DATE:  06/28/2011 PROCEDURE:  Colonoscopy 19147 ASA CLASS:  Class I INDICATIONS:  hematochezia, colorectal cancer screening, average risk painless hematochezia x 1 year, anoscopy shows small hemorrhoids, bleeding recurs after treatment MEDICATIONS:   MAC sedation, administered by CRNA, propofol (Diprivan) 300 mg  DESCRIPTION OF PROCEDURE:   After the risks and benefits and of the procedure were explained, informed consent was obtained. Digital rectal exam was performed and revealed no rectal masses. The LB PCF-H180AL [id number] endoscope was introduced through the anus and advanced to the cecum, which was identified by both the appendix and ileocecal valve.  The quality of the prep was good, using MoviPrep.  The instrument was then slowly withdrawn as the colon was fully examined. <<PROCEDUREIMAGES>>  FINDINGS:  Internal Hemorrhoids were found (see image4). no active bleeding, no fissure  This was otherwise a normal examination of the colon (see image1, image2, and image3).   Retroflexed views in the rectum revealed no abnormalities.    The scope was then withdrawn from the patient and the procedure completed.  COMPLICATIONS:  None ENDOSCOPIC IMPRESSION: 1) Internal hemorrhoids 2) Otherwise normal examination hematochezia due to hemorrhoids RECOMMENDATIONS: Anusol HC supp 1 hs consider referral for band ligation  REPEAT EXAM:  In 10 year(s) for.  ______________________________ Hedwig Morton. Juanda Chance, MD  CC:  n. eSIGNED:   Hedwig Morton. Christen Bedoya at 06/28/2011 12:02 PM  Elwanda [place], [phone]

## 2011-06-28 NOTE — Progress Notes (Signed)

## 2011-06-28 NOTE — Progress Notes (Signed)
Patient did not experience any of the following events: a burn prior to discharge; a fall within the facility; wrong site/side/patient/procedure/implant event; or a hospital transfer or hospital admission upon discharge from the facility. (G8907) Patient did not have preoperative order for IV antibiotic SSI prophylaxis. (G8918)  

## 2011-06-29 ENCOUNTER — Telehealth: Payer: Self-pay | Admitting: *Deleted

## 2011-06-29 NOTE — Telephone Encounter (Signed)
  Follow up Call-  Call back number 06/28/2011  Post procedure Call Back phone  # [phone]  Permission to leave phone message Yes     Patient questions:  Do you have a fever, pain , or abdominal swelling? no Pain Score  0 *  Have you tolerated food without any problems? yes  Have you been able to return to your normal activities? yes  Do you have any questions about your discharge instructions: Diet   no Medications  no Follow up visit  no  Do you have questions or concerns about your Care? no  Actions: * If pain score is 4 or above: No action needed, pain <4.

## 2012-01-11 IMAGING — CT CT ABD-PELV W/O CM
1 of 2 series · 15 of 32 positions shown, 19 images · non-contrast
Comparison: CT abdomen pelvis of 05/30/2007

CLINICAL DATA: Left flank pain, hematuria for 1 day

CT ABDOMEN AND PELVIS WITHOUT CONTRAST
TECHNIQUE: Multidetector CT imaging of the abdomen and pelvis was
performed following the standard protocol without intravenous
contrast.

[Series 2: under 200# stone no prev · axial · 0.74mm/px · z∈[-430,-5]mm · 15 of 95 slices shown, 19 images]
[im 5/95  soft-tissue]
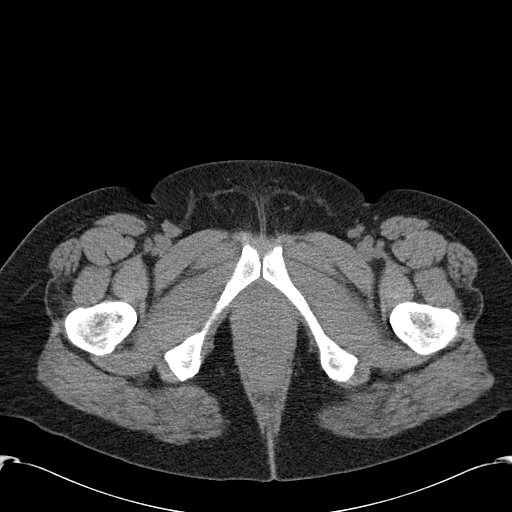
[im 5/95  bone]
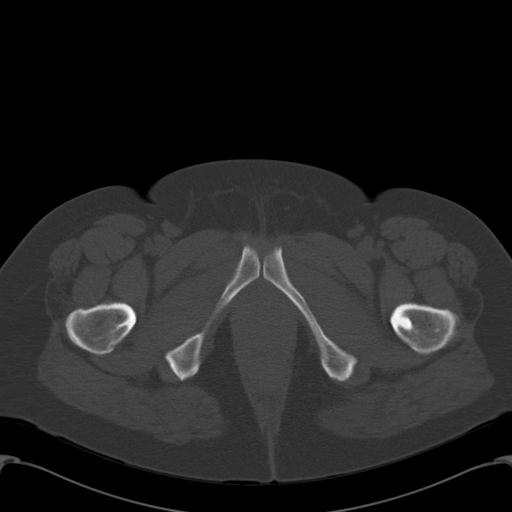
[im 13/95  soft-tissue]
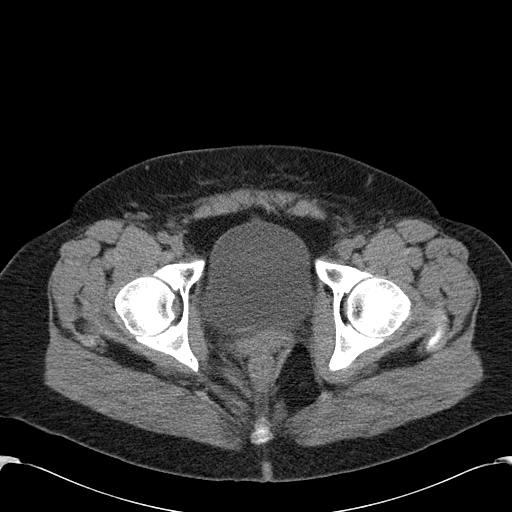
[im 21/95  soft-tissue]
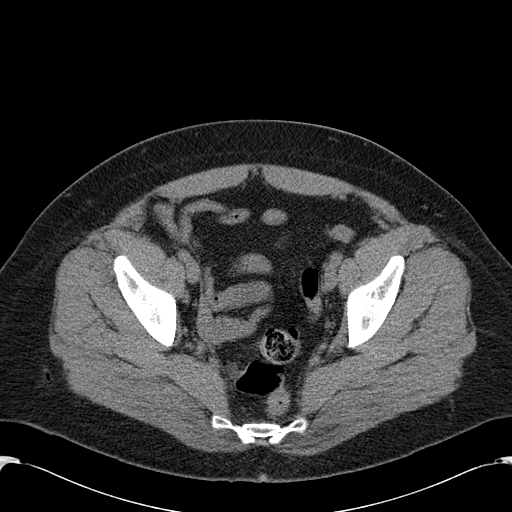
[im 25/95  soft-tissue]
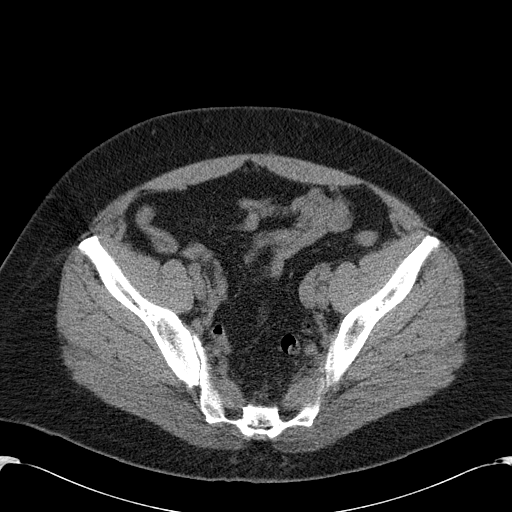
[im 33/95  soft-tissue]
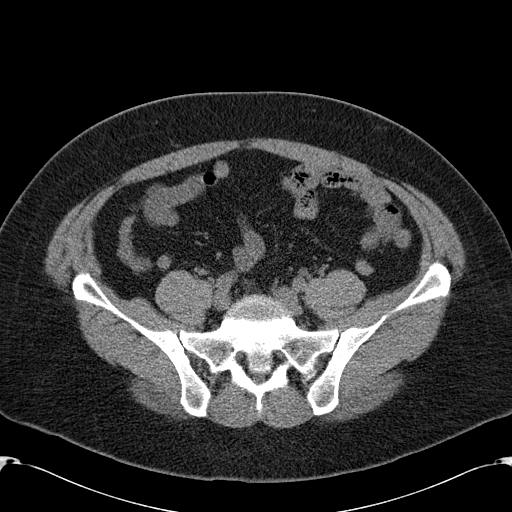
[im 41/95  soft-tissue]
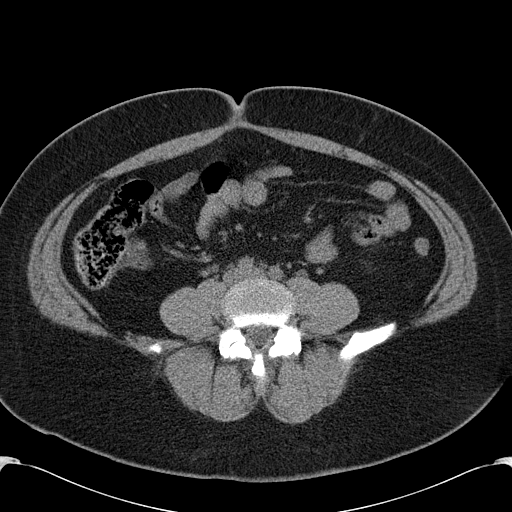
[im 50/95  soft-tissue]
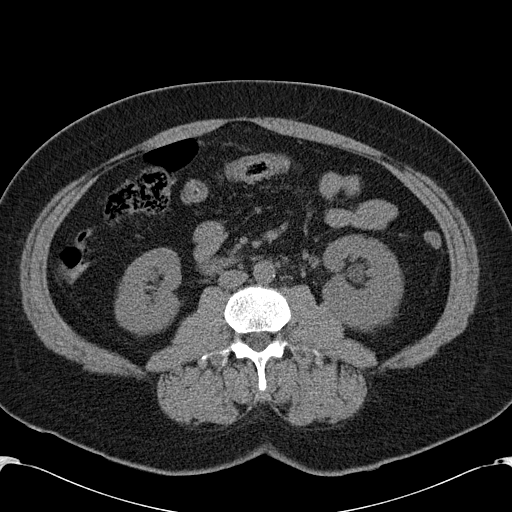
[im 54/95  soft-tissue]
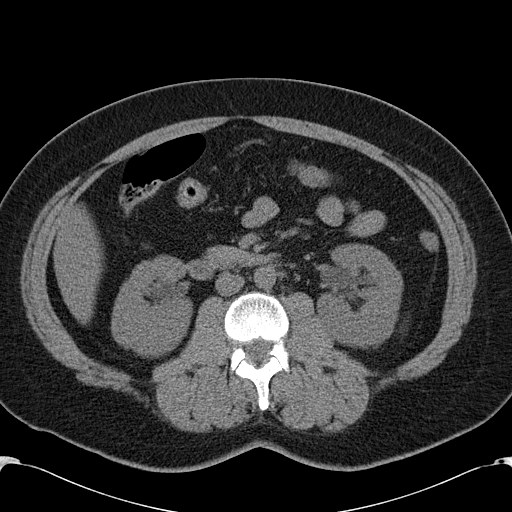
[im 62/95  soft-tissue]
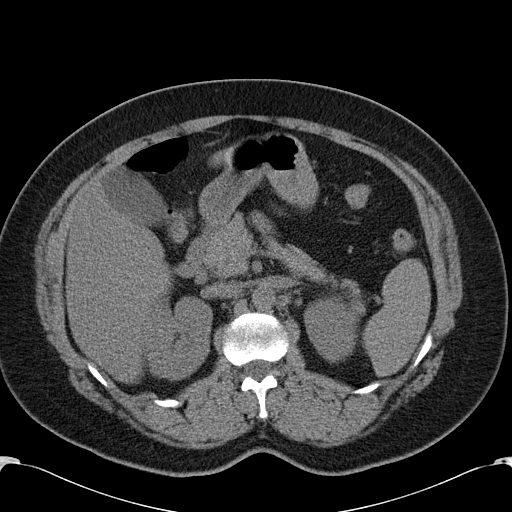
[im 62/95  bone]
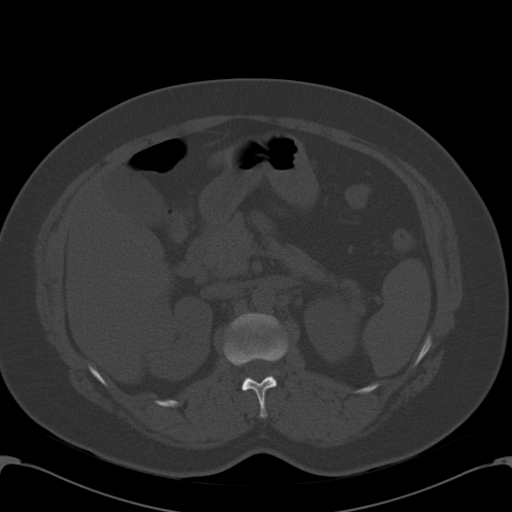
[im 70/95  soft-tissue]
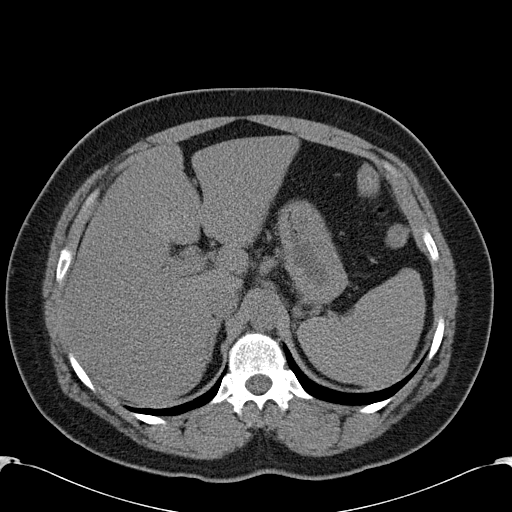
[im 74/95  soft-tissue]
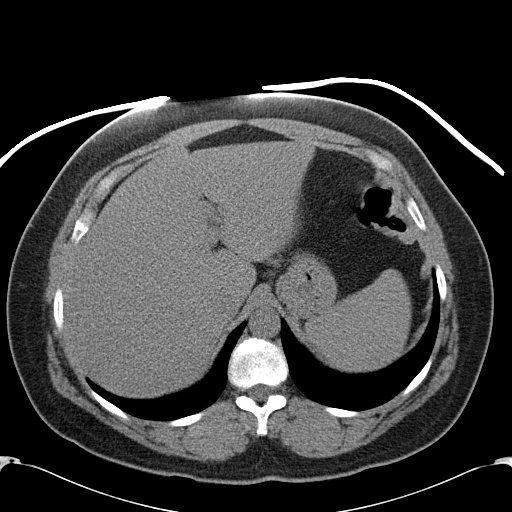
[im 78/95  lung]
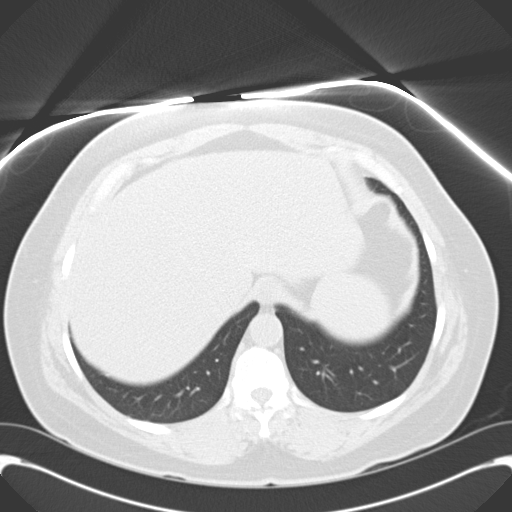
[im 82/95  soft-tissue]
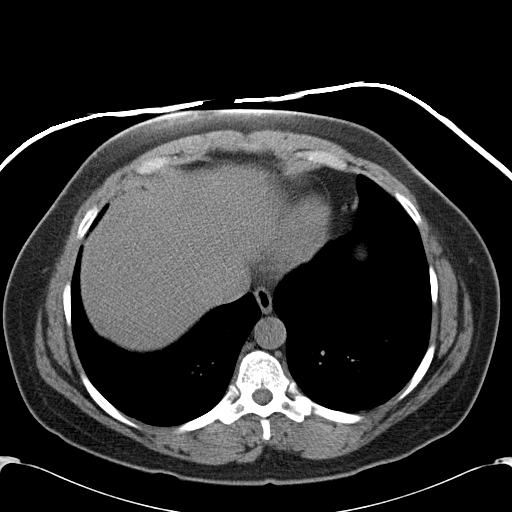
[im 82/95  lung]
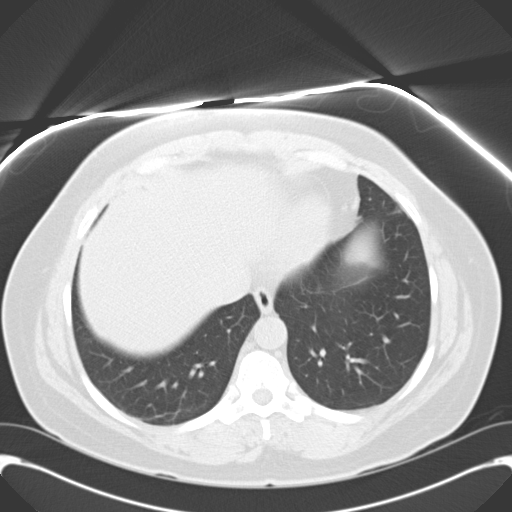
[im 86/95  lung]
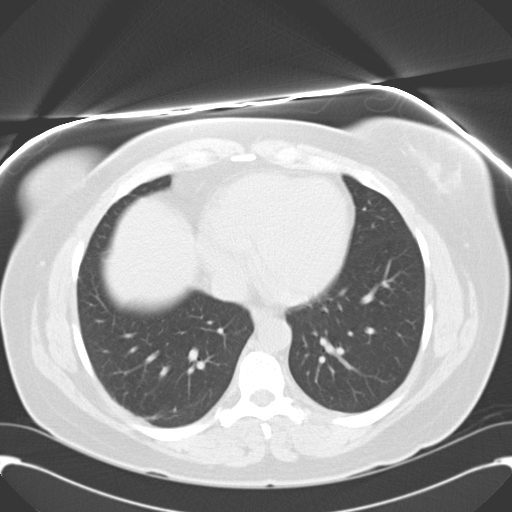
[im 90/95  soft-tissue]
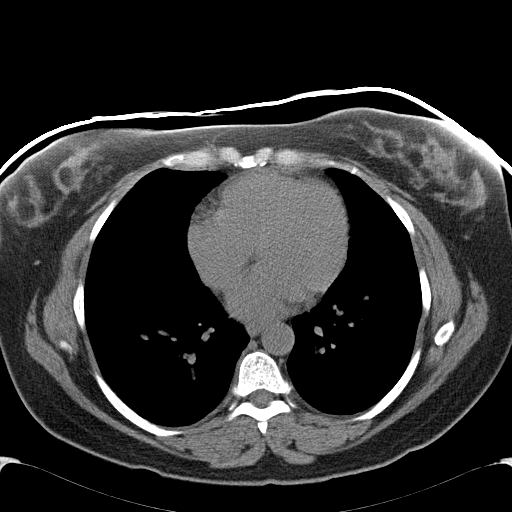
[im 90/95  lung]
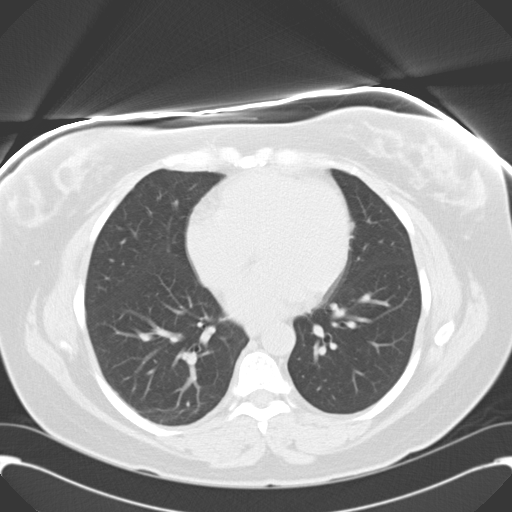

[15 of 32 positions shown; findings below may reference images not displayed]

FINDINGS: The lung bases are clear.  The liver is diffusely low in
attenuation suggesting fatty infiltration.  No calcified gallstones
are seen.  The pancreas is normal in size and the pancreatic duct
is not dilated.  The adrenal glands and spleen are unremarkable.
The stomach is not well distended.

There is mild left hydronephrosis and proximal hydroureter noted to
a point of partial obstruction by a 4 mm proximal left ureteral
calculus.  No other definite renal calculi are seen.  The abdominal
aorta is normal in caliber.

The more distal ureters are normal in caliber.  No distal ureteral
calculi are noted.  The urinary bladder is unremarkable.  The
patient has previously undergone hysterectomy.  No pelvic mass or
fluid is seen.
IMPRESSION: 1.  Mild left hydronephrosis caused by a 4 mm proximal left
ureteral calculus.
2.  No other renal calculi are seen.
3.  Suspect fatty infiltration of the liver.

## 2012-04-19 ENCOUNTER — Other Ambulatory Visit: Payer: Self-pay

## 2012-04-19 DIAGNOSIS — Z1231 Encounter for screening mammogram for malignant neoplasm of breast: Secondary | ICD-10-CM

## 2012-06-24 ENCOUNTER — Ambulatory Visit
Admission: RE | Admit: 2012-06-24 | Discharge: 2012-06-24 | Disposition: A | Discharge status: Home / Self Care | Payer: BC Managed Care – PPO | Source: Ambulatory Visit

## 2012-06-24 DIAGNOSIS — Z1231 Encounter for screening mammogram for malignant neoplasm of breast: Secondary | ICD-10-CM

## 2012-06-26 ENCOUNTER — Other Ambulatory Visit: Payer: Self-pay | Admitting: Obstetrics and Gynecology

## 2012-06-26 DIAGNOSIS — R928 Other abnormal and inconclusive findings on diagnostic imaging of breast: Secondary | ICD-10-CM

## 2012-06-28 ENCOUNTER — Ambulatory Visit
Admission: RE | Admit: 2012-06-28 | Discharge: 2012-06-28 | Disposition: A | Discharge status: Home / Self Care | Payer: BC Managed Care – PPO | Source: Ambulatory Visit | Attending: Obstetrics and Gynecology | Admitting: Obstetrics and Gynecology

## 2012-06-28 DIAGNOSIS — R928 Other abnormal and inconclusive findings on diagnostic imaging of breast: Secondary | ICD-10-CM

## 2012-07-03 ENCOUNTER — Other Ambulatory Visit: Payer: BC Managed Care – PPO

## 2013-01-23 ENCOUNTER — Encounter (INDEPENDENT_AMBULATORY_CARE_PROVIDER_SITE_OTHER): Payer: Self-pay

## 2013-01-23 ENCOUNTER — Encounter: Payer: Self-pay | Admitting: Neurology

## 2013-01-23 ENCOUNTER — Ambulatory Visit (INDEPENDENT_AMBULATORY_CARE_PROVIDER_SITE_OTHER): Payer: BC Managed Care – PPO | Admitting: Neurology

## 2013-01-23 VITALS — BP 137/84 | HR 89 | Ht 66.0 in | Wt 215.0 lb

## 2013-01-23 DIAGNOSIS — R51 Headache: Secondary | ICD-10-CM

## 2013-01-23 DIAGNOSIS — R4189 Other symptoms and signs involving cognitive functions and awareness: Secondary | ICD-10-CM

## 2013-01-23 DIAGNOSIS — F09 Unspecified mental disorder due to known physiological condition: Secondary | ICD-10-CM

## 2013-01-23 DIAGNOSIS — R519 Headache, unspecified: Secondary | ICD-10-CM | POA: Insufficient documentation

## 2013-01-23 DIAGNOSIS — G4733 Obstructive sleep apnea (adult) (pediatric): Secondary | ICD-10-CM | POA: Insufficient documentation

## 2013-01-23 NOTE — Progress Notes (Signed)
[facility]    Provider:  Dr Janann Colonel Referring Provider: Rodena Medin, FNP Primary Care Physician:  [name], MD  CC:  headache  HPI:  Audrey Hurley is a 48 y.o. female here as a referral from Dr. Sharlett Iles for headache  Headache symptoms started around 6 months ago, comes and goes. Described as a pinching/stinging sensation. Occurs 1-2 times a month. Typically lasts half a day. At worst is a 5/10. No N/V. No photo or phonophobia. Will occasionally have some blurry vision with the symptoms. No focal weakness or sensory changes.   Wears a CPAP for OSA. When she wakes up often has a dull headache. Some mornings will wake up feeling tired. Last adjustments to machine were around 1 year ago. Wears it nightly, feels better with it.   Notes she is getting more forgetful. Notes some gait instability.   Has history of HTN, DM. Has history of melanoma, s/p excision from back in 2010. Follows up with dermatologist on a regular basis.   Also notes some intermittent episodes of pain in R lumbar region. Non-radiating, not causing any weakness or sensory changes. Described as a "catch" and then sharp stabbing pain.   Review of Systems: Out of a complete 14 system review, the patient complains of only the following symptoms, and all other reviewed systems are negative. Positive for headache numbness flushing  History   Social History  . Marital Status: Married    Spouse Name: Benny     Number of Children: 2  . Years of Education: 12   Occupational History  . pharmacy tech   .     Social History Main Topics  . Smoking status: Never Smoker   . Smokeless tobacco: Never Used  . Alcohol Use: Yes     Comment: rare  . Drug Use: No  . Sexual Activity: Not on file   Other Topics Concern  . Not on file   Social History Narrative   Patient is married [name])   Patient has 2 children.    Patient is currently working full-time.    Patient is  right-handed.    Patient has 12th grade education.     Family History  Problem Relation Age of Onset  . Heart disease Paternal Grandmother   . Hypertension Mother   . Breast cancer Maternal Aunt   . Colon polyps Father   . Prostate cancer Maternal Grandfather   . Heart disease Maternal Grandfather   . Prostate cancer Maternal Grandfather   . Hypertension Son     Past Medical History  Diagnosis Date  . Fatty liver 05/03/09    seen on CT  . Ureteral calculus 05/03/09  . Depression   . Hypertension   . Hyperlipidemia   . Melanoma   . Sleep apnea   . Allergy     seasonal-takes zyrtec  . Diabetes mellitus     taking metformin    Past Surgical History  Procedure Laterality Date  . Tubal ligation    . Endometrial ablation w/ novasure    . Cesarean section  1992  . Dilation and curettage of uterus    . Sling cystourethropexy    . Laparoscopic hysterectomy  2007    with lysis of adhesions    Current Outpatient Prescriptions  Medication Sig Dispense Refill  . Calcium Carbonate-Vitamin D (CALCIUM 600 + D PO) Take 1 tablet by mouth 2 (two) times daily.        . citalopram (  CELEXA) 20 MG tablet Take 20 mg by mouth daily.        Marland Kitchen docusate sodium (COLACE) 100 MG capsule Take 100 mg by mouth daily.        . fenofibrate micronized (LOFIBRA) 67 MG capsule Take 67 mg by mouth daily before breakfast.        . fexofenadine (ALLEGRA) 180 MG tablet Take 180 mg by mouth daily.      . fluticasone (FLONASE) 50 MCG/ACT nasal spray       . hydrochlorothiazide 25 MG tablet Take 25 mg by mouth daily.        . metFORMIN (GLUCOPHAGE-XR) 500 MG 24 hr tablet 500 mg 2 (two) times daily.       . Multiple Vitamin (MULTIVITAMIN) tablet Take 1 tablet by mouth daily.        Marland Kitchen omeprazole (PRILOSEC) 20 MG capsule 40 mg bid      . pantoprazole (PROTONIX) 40 MG tablet       . Probiotic Product (PROBIOTIC DAILY PO) Take by mouth.      . psyllium (METAMUCIL SMOOTH TEXTURE) 28 % packet Take 1 packet daily.   1 packet  0  . pyridOXINE (VITAMIN B-6) 100 MG tablet Take 200 mg by mouth daily.        Marland Kitchen zolpidem (AMBIEN) 10 MG tablet Take 10 mg by mouth at bedtime as needed for sleep (take 1/2 tab hs).       No current facility-administered medications for this visit.    Allergies as of 01/23/2013 - Review Complete 01/23/2013  Allergen Reaction Noted  . Aspirin  07/28/2010  . Ciprocin-fluocin-procin [fluocinolone acetonide]  07/28/2010  . Sulfa antibiotics  07/28/2010    Vitals: BP 137/84  Pulse 89  Ht 5\' 6"  (1.676 m)  Wt 215 lb (97.523 kg)  BMI 34.72 kg/m2 Last Weight:  Wt Readings from Last 1 Encounters:  01/23/13 215 lb (97.523 kg)   Last Height:   Ht Readings from Last 1 Encounters:  01/23/13 5\' 6"  (1.676 m)     Physical exam: Exam: Gen: NAD, conversant Eyes: anicteric sclerae, moist conjunctivae HENT: Atraumatic, oropharynx clear, no temporal tenderness to palpation Neck: Trachea midline; supple,  Lungs: CTA, no wheezing, rales, rhonic                          CV: RRR, no MRG Abdomen: Soft, non-tender;  Extremities: No peripheral edema  Skin: Normal temperature, no rash,  Psych: Appropriate affect, pleasant  Neuro: MS: AA&Ox3, appropriately interactive, normal affect   Speech: fluent w/o paraphasic error  Memory: good recent and remote recall  CN: PERRL, EOMI no nystagmus, visual fields full to finger count bilaterally, funduscopic exam within normal limits no ptosis, sensation intact to LT V1-V3 bilat, face symmetric, no weakness, hearing grossly intact, palate elevates symmetrically, shoulder shrug 5/5 bilat,  tongue protrudes midline, no fasiculations noted.  Motor: normal bulk and tone Strength: 5/5  In all extremities  Coord: rapid alternating and point-to-point (FNF, HTS) movements intact.  Reflexes: symmetrical, bilat downgoing toes  Sens: LT intact in all extremities  Gait: posture, stance, stride and arm-swing normal. Tandem gait intact. Able to  walk on heels and toes. Romberg absent.   Assessment:  After physical and neurologic examination, review of laboratory studies, imaging, neurophysiology testing and pre-existing records, assessment will be reviewed on the problem list.  Plan:  Treatment plan and additional workup will be reviewed under Problem List.  1)Headache  2)OSA 3)Cognitive decline 4)Lumbar back pain  [age] year old woman with history of sleep apnea presenting for initial evaluation of headache and hip pain. Patient describes a intermittent temporal headache occurring 1-2 times a month, and a more frequent dull headache occurring in the morning. Suspect sleep apnea is likely contributing to the symptoms. Headaches appear mild overall, her exam is overall nonfocal making a central structural process less likely. No indication for brain MRI imaging at this time. Counseled her temporal headache could potentially be sinus related versus tension type headache versus related to her sleep apnea. She expressed understanding, will hold off on medication at this time. Discussed different options for her to her intermittent lumbar back and hip pain. Discussed option of physical therapy, patient wishes to hold off on that at this time. Counseled her to try stretching and using heating pad. Dissection her to followup with her sleep doctor as needed. Patient also notes cognitive decline and some gait instability. Suspect cognitive decline is likely again sleep apnea related. Will check lab work for reversible causes. Followup as needed.   Elspeth ChoPeter Nalea Salce, DO  [facility] [address] [place], [place] [id number]  Phone [phone] Fax [phone]

## 2013-01-23 NOTE — Patient Instructions (Signed)
Overall you are doing fairly well but I do want to suggest a few things today:   Remember to drink plenty of fluid, eat healthy meals and do not skip any meals. Try to eat protein with a every meal and eat a healthy snack such as fruit or nuts in between meals. Try to keep a regular sleep-wake schedule and try to exercise daily, particularly in the form of walking, 20-30 minutes a day, if you can.   I suspect your headaches are likely partly related to your sleep apnea. If they continue consider following up with your sleep doctor.   We will check some blood work today.  If your hip/back pain continues we can consider a referral to physical therapy.   We will follow up with you as needed. Please call us with any interim questions, concerns, problems, updates or refill requests.   Please also call us for any test results so we can go over those with you on the phone.  My clinical assistant and will answer any of your questions and relay your messages to me and also relay most of my messages to you.   Our phone number is [phone]. We also have an after hours call service for urgent matters and there is a physician on-call for urgent questions. For any emergencies you know to call 911 or go to the nearest emergency room

## 2013-01-27 LAB — TSH: TSH: 1.01 u[IU]/mL (ref 0.450–4.500)

## 2013-01-27 LAB — METHYLMALONIC ACID, SERUM: Methylmalonic Acid: 140 nmol/L (ref 0–378)

## 2013-01-27 LAB — VITAMIN B12: Vitamin B-12: 448 pg/mL (ref 211–946)

## 2013-04-11 ENCOUNTER — Telehealth: Payer: Self-pay | Admitting: Internal Medicine

## 2013-04-11 ENCOUNTER — Other Ambulatory Visit: Payer: Self-pay

## 2013-04-11 DIAGNOSIS — Z1231 Encounter for screening mammogram for malignant neoplasm of breast: Secondary | ICD-10-CM

## 2013-04-11 NOTE — Telephone Encounter (Signed)
Sounds like an IBS, last colon 2013, advice to add Metamucil to bulk up the stool.

## 2013-04-11 NOTE — Telephone Encounter (Signed)
Patient states for the last 3 months, she has been having a change in bowels. States her stools go from hard to loose. Consistency is like a cow paddy. Also, states sometimes it feels like "needles in my vagina area." Offered OV with extender next week but she prefers OV with Dr. [name]. Scheduled her on 04/25/13 at 2:00 PM.

## 2013-04-11 NOTE — Telephone Encounter (Signed)
Spoke with patient and she is taking Metamucil. She is going to stop her Colace and see if that helps any.

## 2013-04-17 ENCOUNTER — Encounter: Payer: Self-pay | Admitting: *Deleted

## 2013-04-25 ENCOUNTER — Ambulatory Visit: Payer: BC Managed Care – PPO | Admitting: Internal Medicine

## 2013-07-07 ENCOUNTER — Ambulatory Visit
Admission: RE | Admit: 2013-07-07 | Discharge: 2013-07-07 | Disposition: A | Discharge status: Home / Self Care | Payer: BC Managed Care – PPO | Source: Ambulatory Visit

## 2013-07-07 DIAGNOSIS — Z1231 Encounter for screening mammogram for malignant neoplasm of breast: Secondary | ICD-10-CM

## 2014-05-18 ENCOUNTER — Other Ambulatory Visit: Payer: Self-pay

## 2014-05-18 DIAGNOSIS — Z1231 Encounter for screening mammogram for malignant neoplasm of breast: Secondary | ICD-10-CM

## 2014-07-13 ENCOUNTER — Ambulatory Visit
Admission: RE | Admit: 2014-07-13 | Discharge: 2014-07-13 | Disposition: A | Discharge status: Home / Self Care | Payer: BLUE CROSS/BLUE SHIELD | Source: Ambulatory Visit

## 2014-07-13 DIAGNOSIS — Z1231 Encounter for screening mammogram for malignant neoplasm of breast: Secondary | ICD-10-CM

## 2014-10-27 ENCOUNTER — Ambulatory Visit: Payer: BLUE CROSS/BLUE SHIELD | Admitting: Gastroenterology

## 2014-11-05 ENCOUNTER — Encounter: Payer: Self-pay | Admitting: Gastroenterology

## 2014-11-05 ENCOUNTER — Other Ambulatory Visit (INDEPENDENT_AMBULATORY_CARE_PROVIDER_SITE_OTHER): Payer: BLUE CROSS/BLUE SHIELD

## 2014-11-05 ENCOUNTER — Ambulatory Visit (INDEPENDENT_AMBULATORY_CARE_PROVIDER_SITE_OTHER): Payer: BLUE CROSS/BLUE SHIELD | Admitting: Gastroenterology

## 2014-11-05 VITALS — BP 118/78 | HR 64 | Ht 66.0 in | Wt 211.0 lb

## 2014-11-05 DIAGNOSIS — K5909 Other constipation: Secondary | ICD-10-CM

## 2014-11-05 DIAGNOSIS — K5900 Constipation, unspecified: Secondary | ICD-10-CM | POA: Insufficient documentation

## 2014-11-05 DIAGNOSIS — R109 Unspecified abdominal pain: Secondary | ICD-10-CM | POA: Diagnosis not present

## 2014-11-05 DIAGNOSIS — R10A1 Flank pain, right side: Secondary | ICD-10-CM | POA: Insufficient documentation

## 2014-11-05 LAB — TSH: TSH: 1.01 u[IU]/mL (ref 0.35–4.50)

## 2014-11-05 NOTE — Progress Notes (Signed)
11/05/2014 MALEAH RABAGO [id number] [birth year]   HISTORY OF PRESENT ILLNESS:  This is a 49 year old female who is previously known to Dr. [name].  Her care will be assumed by Dr. Silverio Decamp in her absence.  She presents to our office today with complaints of constipation.  Had a colonoscopy by Dr. [name] in 06/2011 with only internal hemorrhoids seen.  She takes metamucil capsules daily.  Tells me that she sometimes goes 3 days between bowel movements.  Denies any rectal bleeding or abdominal pain.  She had an episode of right flank pain that lasted 2 days on and off.  She has history of kidney stones so saw urology.  CT scan of the abdomen and pelvis was performed without contrast.  It was negative for stones or any other acute, pain-causing issues.  She was treated with muscle relaxant, but pain resolved on its own.  Has not recurred since that time.  Did not have any other associated GI symptoms with it including abdominal pain.  She tells me that her PCP did routine labs recently, but does not think that thyroid studies were performed.  Past Medical History  Diagnosis Date  . Fatty liver 05/03/09    seen on CT  . Ureteral calculus 05/03/09  . Depression   . Hypertension   . Hyperlipidemia   . Melanoma ([place])   . Sleep apnea     On CPAP  . Allergy     seasonal-takes zyrtec  . Diabetes mellitus     taking metformin  . GERD (gastroesophageal reflux disease)    Past Surgical History  Procedure Laterality Date  . Tubal ligation    . Endometrial ablation w/ novasure    . Cesarean section  1992  . Dilation and curettage of uterus    . Sling cystourethropexy    . Laparoscopic hysterectomy  2007    with lysis of adhesions    reports that she has never smoked. She has never used smokeless tobacco. She reports that she drinks alcohol. She reports that she does not use illicit drugs. family history includes Breast cancer in her maternal aunt; Colon polyps in her father; Heart disease  in her maternal grandfather and paternal grandmother; Hypertension in her mother and son; Prostate cancer in her maternal grandfather and maternal grandfather. Allergies  Allergen Reactions  . Aspirin     Nose bleeds  . Ciprocin-Fluocin-Procin [Fluocinolone Acetonide]     RASH, SOB  . Sulfa Antibiotics     RASH, Itching      Outpatient Encounter Prescriptions as of 11/05/2014  Medication Sig  . Calcium Carbonate-Vitamin D (CALCIUM 600 + D PO) Take 1 tablet by mouth 2 (two) times daily.    . citalopram (CELEXA) 20 MG tablet Take 20 mg by mouth daily.    Marland Kitchen docusate sodium (COLACE) 100 MG capsule Take 100 mg by mouth daily.    . fenofibrate micronized (LOFIBRA) 134 MG capsule Take 134 mg by mouth daily before breakfast.  . fexofenadine (ALLEGRA) 180 MG tablet Take 180 mg by mouth daily.  . fluticasone (FLONASE) 50 MCG/ACT nasal spray   . hydrochlorothiazide 25 MG tablet Take 25 mg by mouth daily.    . metFORMIN (GLUCOPHAGE-XR) 500 MG 24 hr tablet 500 mg 2 (two) times daily.   . Multiple Vitamin (MULTIVITAMIN) tablet Take 1 tablet by mouth daily.    . nabumetone (RELAFEN) 500 MG tablet Take 500 mg by mouth daily.  Marland Kitchen omeprazole (PRILOSEC) 20 MG  capsule 40 mg bid  . pantoprazole (PROTONIX) 40 MG tablet   . Probiotic Product (PROBIOTIC DAILY PO) Take by mouth.  . psyllium (METAMUCIL SMOOTH TEXTURE) 28 % packet Take 1 packet daily.  Marland Kitchen pyridOXINE (VITAMIN B-6) 100 MG tablet Take 200 mg by mouth daily.    Marland Kitchen zolpidem (AMBIEN) 10 MG tablet Take 10 mg by mouth at bedtime as needed for sleep (take 1/2 tab hs).  . [DISCONTINUED] fenofibrate micronized (LOFIBRA) 67 MG capsule Take by mouth daily before breakfast.    No facility-administered encounter medications on file as of 11/05/2014.     REVIEW OF SYSTEMS  : All other systems reviewed and negative except where noted in the History of Present Illness.   PHYSICAL EXAM: BP 118/78 mmHg  Pulse 64  Ht 5\' 6"  (1.676 m)  Wt 211 lb (95.709 kg)   BMI 34.07 kg/m2 General: Well developed white female in no acute distress Head: Normocephalic and atraumatic Eyes:  Sclerae anicteric.  Conjunctiva pink. Ears: Normal auditory acuity. Lungs: Clear throughout to auscultation Heart: Regular rate and rhythm Abdomen: Soft, non-distended.  Normal bowel sounds.  Non-tender.                    Musculoskeletal: Symmetrical with no gross deformities  Skin: No lesions on visible extremities Extremities: No edema  Neurological: Alert oriented x 4, grossly non-focal Psychological:  Alert and cooperative. Normal mood and affect  ASSESSMENT AND PLAN: -Constipation:  Will add Miralax to her daily fiber supplement.  Will check TSH today. -Right flank pain:  Now resolved.  No other associated GI symptoms when this occurred.  Doubt GI etiology.  CC:  [name], NP

## 2014-11-05 NOTE — Patient Instructions (Signed)
Please go to the basement level to have your labs drawn.  Take Miralax daily. 17 grams in 8 oz of water.

## 2014-11-11 NOTE — Progress Notes (Signed)
Reviewed and agree with documentation and assessment and plan. K. Veena Nandigam , MD   

## 2015-04-22 ENCOUNTER — Encounter: Payer: Self-pay | Admitting: Gastroenterology

## 2015-04-22 ENCOUNTER — Ambulatory Visit (INDEPENDENT_AMBULATORY_CARE_PROVIDER_SITE_OTHER): Payer: BLUE CROSS/BLUE SHIELD | Admitting: Gastroenterology

## 2015-04-22 VITALS — BP 118/78 | HR 84 | Ht 65.25 in | Wt 206.4 lb

## 2015-04-22 DIAGNOSIS — K648 Other hemorrhoids: Secondary | ICD-10-CM | POA: Diagnosis not present

## 2015-04-22 DIAGNOSIS — K760 Fatty (change of) liver, not elsewhere classified: Secondary | ICD-10-CM | POA: Diagnosis not present

## 2015-04-22 NOTE — Patient Instructions (Signed)
Follow up in 6 months Hemorrhoidal brochure given

## 2015-05-03 NOTE — Progress Notes (Signed)
Audrey Hurley    YE:[id number]    Jul 31, 1965  Primary Care Physician:Strader, Laurita Quint, NP  Referring Physician: [name], NP [address] [place], [place] [address]  Chief complaint:  Fatty liver   HPI: 50 year old female previously followed by Dr. [name] is here to establish care with me. She was last seen by Alonza Bogus in October 2016 for constipation. Her last colonoscopy in 2013 was normal except for internal hemorrhoids. She had blood throughout work through her [place] office, hemoglobin A1c 6.0, elevated triglyceride 180, LDL 107 and HDL 27. CBC BMP and LFT within normal limits. Abdominal ultrasound showed findings suggestive of fatty liver otherwise was unremarkable   Outpatient Encounter Prescriptions as of 04/22/2015  Medication Sig  . Calcium Carbonate-Vitamin D (CALCIUM 600 + D PO) Take 1 tablet by mouth 2 (two) times daily.    . Cholecalciferol (VITAMIN D) 2000 units CAPS Take 1 capsule by mouth at bedtime.  . citalopram (CELEXA) 20 MG tablet Take 20 mg by mouth daily.    . Coenzyme Q10 (CO Q-10) 100 MG CAPS Take 1 capsule by mouth daily.  . fenofibrate micronized (LOFIBRA) 134 MG capsule Take 134 mg by mouth daily before breakfast.  . fexofenadine (ALLEGRA) 180 MG tablet Take 180 mg by mouth daily.  . fluticasone (FLONASE) 50 MCG/ACT nasal spray   . hydrochlorothiazide 25 MG tablet Take 25 mg by mouth daily.    . Liraglutide (VICTOZA) 18 MG/3ML SOPN Inject 1.2 mg into the skin daily.  . Multiple Vitamin (MULTIVITAMIN) tablet Take 1 tablet by mouth daily.    . nabumetone (RELAFEN) 500 MG tablet Take 500 mg by mouth as needed.   Marland Kitchen omega-3 acid ethyl esters (LOVAZA) 1 g capsule Take 1 g by mouth 2 (two) times daily.  Marland Kitchen omeprazole (PRILOSEC) 20 MG capsule 40 mg prn  . Probiotic Product (PROBIOTIC DAILY PO) Take by mouth.  . psyllium (METAMUCIL SMOOTH TEXTURE) 28 % packet Take 1 packet daily.  Marland Kitchen pyridOXINE (VITAMIN B-6) 100 MG tablet Take 200 mg by  mouth daily.    . vitamin E 400 UNIT capsule Take 400 Units by mouth daily.  Marland Kitchen zolpidem (AMBIEN) 10 MG tablet Take 10 mg by mouth at bedtime as needed for sleep (take 1/2 tab hs).  . [DISCONTINUED] docusate sodium (COLACE) 100 MG capsule Take 100 mg by mouth daily.    . [DISCONTINUED] metFORMIN (GLUCOPHAGE-XR) 500 MG 24 hr tablet 500 mg 2 (two) times daily.   . [DISCONTINUED] pantoprazole (PROTONIX) 40 MG tablet    No facility-administered encounter medications on file as of 04/22/2015.    Allergies as of 04/22/2015 - Review Complete 04/22/2015  Allergen Reaction Noted  . Aspirin  07/28/2010  . Ciprocin-fluocin-procin [fluocinolone acetonide]  07/28/2010  . Sulfa antibiotics  07/28/2010    Past Medical History  Diagnosis Date  . Fatty liver 05/03/09    seen on CT  . Ureteral calculus 05/03/09  . Depression   . Hypertension   . Hyperlipidemia   . Melanoma ([place])   . Sleep apnea     On CPAP  . Allergy     seasonal-takes zyrtec  . Diabetes mellitus     taking metformin  . GERD (gastroesophageal reflux disease)     Past Surgical History  Procedure Laterality Date  . Tubal ligation    . Endometrial ablation w/ novasure    . Cesarean section  1992  . Dilation and curettage  of uterus    . Sling cystourethropexy    . Laparoscopic hysterectomy  2007    with lysis of adhesions    Family History  Problem Relation Age of Onset  . Heart disease Paternal Grandmother   . Hypertension Mother   . Breast cancer Maternal Aunt   . Colon polyps Father   . Heart disease Maternal Grandfather   . Prostate cancer Maternal Grandfather   . Hypertension Son   . Cirrhosis Maternal Aunt     Social History   Social History  . Marital Status: Married    Spouse Name: Audrey Hurley   . Number of Children: 2  . Years of Education: 12   Occupational History  . pharmacy tech   .     Social History Main Topics  . Smoking status: Never Smoker   . Smokeless tobacco: Never Used  . Alcohol Use: Yes      Comment: rare  . Drug Use: No  . Sexual Activity: Not on file   Other Topics Concern  . Not on file   Social History Narrative   Patient is married Audrey Hurley)   Patient has 2 children.    Patient is currently working full-time.    Patient is right-handed.    Patient has 12th grade education.       Review of systems: Review of Systems  Constitutional: Negative for fever and chills.  HENT: Negative.   Eyes: Negative for blurred vision.  Respiratory: Negative for cough, shortness of breath and wheezing.   Cardiovascular: Negative for chest pain and palpitations.  Gastrointestinal: as per HPI Genitourinary: Negative for dysuria, urgency, frequency and hematuria.  Musculoskeletal: Negative for myalgias, back pain and joint pain.  Skin: Negative for itching and rash.  Neurological: Negative for dizziness, tremors, focal weakness, seizures and loss of consciousness.  Endo/Heme/Allergies: Negative for environmental allergies.  Psychiatric/Behavioral: Negative for depression, suicidal ideas and hallucinations.  All other systems reviewed and are negative.   Physical Exam: Filed Vitals:   04/22/15 1045  BP: 118/78  Pulse: 84   Gen:      No acute distress HEENT:  EOMI, sclera anicteric Neck:     No masses; no thyromegaly Lungs:    Clear to auscultation bilaterally; normal respiratory effort CV:         Regular rate and rhythm; no murmurs Abd:      + bowel sounds; soft, non-tender; no palpable masses, no distension Ext:    No edema; adequate peripheral perfusion Skin:      Warm and dry; no rash Neuro: alert and oriented x 3 Psych: normal mood and affect  Data Reviewed: Reviewed data in epic and records from PMD   Assessment and Plan/Recommendations: 20 yr F with h/o constipation and findings suggestive of fatty liver on abdominal ultrasound here for follow up visit Fatty liver in the setting of metabolic syndrome . LFT within normal limits Advised patient to avoid  carbohydrates and simple sugars, exercise with goal weight loss ~10% body weight in next 3 months She will benefit from tight glycemic control and treatment of hyperlipidemia, statins not contraindicated  in her case Patient is also interested in possible hemorrhoidal banding, was given information and will call to schedule Return in 6 months  K. Denzil Magnuson , MD [phone] Mon-Fri 8a-5p [phone] after 5p, weekends, holidays

## 2015-06-22 ENCOUNTER — Other Ambulatory Visit: Payer: Self-pay | Admitting: Obstetrics and Gynecology

## 2015-06-22 DIAGNOSIS — Z1231 Encounter for screening mammogram for malignant neoplasm of breast: Secondary | ICD-10-CM

## 2015-07-16 ENCOUNTER — Ambulatory Visit
Admission: RE | Admit: 2015-07-16 | Discharge: 2015-07-16 | Disposition: A | Discharge status: Home / Self Care | Payer: BLUE CROSS/BLUE SHIELD | Source: Ambulatory Visit | Attending: Obstetrics and Gynecology | Admitting: Obstetrics and Gynecology

## 2015-07-16 DIAGNOSIS — Z1231 Encounter for screening mammogram for malignant neoplasm of breast: Secondary | ICD-10-CM

## 2016-02-08 ENCOUNTER — Other Ambulatory Visit (INDEPENDENT_AMBULATORY_CARE_PROVIDER_SITE_OTHER): Payer: BC Managed Care – PPO

## 2016-02-08 ENCOUNTER — Encounter: Payer: Self-pay | Admitting: Gastroenterology

## 2016-02-08 ENCOUNTER — Ambulatory Visit (INDEPENDENT_AMBULATORY_CARE_PROVIDER_SITE_OTHER): Payer: BC Managed Care – PPO | Admitting: Gastroenterology

## 2016-02-08 VITALS — BP 120/76 | HR 80 | Ht 66.0 in | Wt 215.4 lb

## 2016-02-08 DIAGNOSIS — R140 Abdominal distension (gaseous): Secondary | ICD-10-CM

## 2016-02-08 DIAGNOSIS — R1013 Epigastric pain: Secondary | ICD-10-CM

## 2016-02-08 DIAGNOSIS — R112 Nausea with vomiting, unspecified: Secondary | ICD-10-CM | POA: Diagnosis not present

## 2016-02-08 LAB — COMPREHENSIVE METABOLIC PANEL
ALT: 25 U/L (ref 0–35)
AST: 21 U/L (ref 0–37)
Albumin: 4.8 g/dL (ref 3.5–5.2)
Alkaline Phosphatase: 43 U/L (ref 39–117)
BUN: 11 mg/dL (ref 6–23)
CO2: 31 mEq/L (ref 19–32)
Calcium: 10.2 mg/dL (ref 8.4–10.5)
Chloride: 101 mEq/L (ref 96–112)
Creatinine, Ser: 0.66 mg/dL (ref 0.40–1.20)
GFR: 100.39 mL/min (ref >60.00)
Glucose, Bld: 110 mg/dL — ABNORMAL HIGH (ref 70–99)
Potassium: 3.7 mEq/L (ref 3.5–5.1)
Sodium: 138 mEq/L (ref 135–145)
Total Bilirubin: 0.6 mg/dL (ref 0.2–1.2)
Total Protein: 7.6 g/dL (ref 6.0–8.3)

## 2016-02-08 LAB — CBC WITH DIFFERENTIAL/PLATELET
Basophils Absolute: 0 10*3/uL (ref 0.0–0.1)
Basophils Relative: 0.3 % (ref 0.0–3.0)
Eosinophils Absolute: 0.2 10*3/uL (ref 0.0–0.7)
Eosinophils Relative: 2.7 % (ref 0.0–5.0)
HCT: 37.5 % (ref 36.0–46.0)
Hemoglobin: 12.7 g/dL (ref 12.0–15.0)
Lymphocytes Relative: 32.9 % (ref 12.0–46.0)
Lymphs Abs: 2.5 10*3/uL (ref 0.7–4.0)
MCHC: 33.8 g/dL (ref 30.0–36.0)
MCV: 89.1 fl (ref 78.0–100.0)
Monocytes Absolute: 0.7 10*3/uL (ref 0.1–1.0)
Monocytes Relative: 8.9 % (ref 3.0–12.0)
Neutro Abs: 4.1 10*3/uL (ref 1.4–7.7)
Neutrophils Relative %: 55.2 % (ref 43.0–77.0)
Platelets: 304 10*3/uL (ref 150.0–400.0)
RBC: 4.21 Mil/uL (ref 3.87–5.11)
RDW: 12.8 % (ref 11.5–15.5)
WBC: 7.4 10*3/uL (ref 4.0–10.5)

## 2016-02-08 LAB — LIPASE: Lipase: 51 U/L (ref 11.0–59.0)

## 2016-02-08 MED ORDER — PANTOPRAZOLE SODIUM 40 MG PO TBEC: 40.0000 mg | DELAYED_RELEASE_TABLET | Freq: Every day | ORAL | 3 refills | Status: DC

## 2016-02-08 NOTE — Patient Instructions (Addendum)
Your physician has requested that you go to the basement for lab work before leaving today  We have sent the following medications to your pharmacy for you to pick up at your convenience:  Protonix (increased to twice a day)  Continue Zofran as needed   You have been scheduled for an abdominal ultrasound at [facility] Radiology (1st floor of hospital) on 02/23/2016 at 9:00am. Please arrive 15 minutes prior to your appointment for registration. Make certain not to have anything to eat or drink after midnight prior to your appointment. Should you need to reschedule your appointment, please contact radiology at [phone]. This test typically takes about 30 minutes to perform.  You have been scheduled for a HIDA scan at [facility] Radiology (1st floor) on 02/23/2016. Please arrive 15 minutes prior to your scheduled appointment at  [id number]. Make certain not to have anything to eat or drink at least 6 hours prior to your test. Should this appointment date or time not work well for you, please call radiology scheduling at [phone].  _____________________________________________________________________ hepatobiliary (HIDA) scan is an imaging procedure used to diagnose problems in the liver, gallbladder and bile ducts. In the HIDA scan, a radioactive chemical or tracer is injected into a vein in your arm. The tracer is handled by the liver like bile. Bile is a fluid produced and excreted by your liver that helps your digestive system break down fats in the foods you eat. Bile is stored in your gallbladder and the gallbladder releases the bile when you eat a meal. A special nuclear medicine scanner (gamma camera) tracks the flow of the tracer from your liver into your gallbladder and small intestine.  During your HIDA scan  You'll be asked to change into a hospital gown before your HIDA scan begins. Your health care team will position you on a table, usually on your back. The radioactive tracer is then  injected into a vein in your arm.The tracer travels through your bloodstream to your liver, where it's taken up by the bile-producing cells. The radioactive tracer travels with the bile from your liver into your gallbladder and through your bile ducts to your small intestine.You may feel some pressure while the radioactive tracer is injected into your vein. As you lie on the table, a special gamma camera is positioned over your abdomen taking pictures of the tracer as it moves through your body. The gamma camera takes pictures continually for about an hour. You'll need to keep still during the HIDA scan. This can become uncomfortable, but you may find that you can lessen the discomfort by taking deep breaths and thinking about other things. Tell your health care team if you're uncomfortable. The radiologist will watch on a computer the progress of the radioactive tracer through your body. The HIDA scan may be stopped when the radioactive tracer is seen in the gallbladder and enters your small intestine. This typically takes about an hour. In some cases extra imaging will be performed if original images aren't satisfactory, if morphine is given to help visualize the gallbladder or if the medication CCK is given to look at the contraction of the gallbladder. This test typically takes 2 hours to complete. ________________________________________________________________________

## 2016-02-08 NOTE — Progress Notes (Signed)
02/08/2016 Audrey Hurley [id number] Mar 03, 1965   HISTORY OF PRESENT ILLNESS:  This is a 51 year old female who is known to Dr. Silverio Decamp.  She comes to our office today with complaints of nausea with vomiting, bloating, and epigastric/right upper quadrant pain/discomfort. She says that these symptoms began 1-2 weeks ago. These are the same symptoms that she has experienced periodically in the past. She says prior to 2 weeks ago she had been feeling fine for quite some time. Says that she started with a lot of nausea and then had one day of vomiting. Also has epigastric discomfort that feels like there is something just sitting there.  Pain into RUQ as well.  Has been using Zofran almost on a daily basis. Complains of a lot of belching as well, which starts as soon as she wakes up in the morning. She is on pantoprazole 40 mg daily and actually increased that to twice a day for the last 2 days. She does not feel like this is heartburn/reflux/indigestion, however.  She's had abdominal ultrasounds in the past, the last being over 2 years ago. Has never undergone a HIDA scan in the past. She thinks that this is a gallbladder issue.   Past Medical History:  Diagnosis Date  . Allergy    seasonal-takes zyrtec  . Depression   . Diabetes mellitus    taking metformin  . Fatty liver 05/03/09   seen on CT  . GERD (gastroesophageal reflux disease)   . Hyperlipidemia   . Hypertension   . Melanoma ([place])   . Sleep apnea    On CPAP  . Ureteral calculus 05/03/09   Past Surgical History:  Procedure Laterality Date  . CESAREAN SECTION  1992  . DILATION AND CURETTAGE OF UTERUS    . ENDOMETRIAL ABLATION W/ NOVASURE    . LAPAROSCOPIC HYSTERECTOMY  2007   with lysis of adhesions  . Sling cystourethropexy    . TUBAL LIGATION      reports that she has never smoked. She has never used smokeless tobacco. She reports that she drinks alcohol. She reports that she does not use drugs. family history  includes Breast cancer in her maternal aunt; Cirrhosis in her maternal aunt; Colon polyps in her father; Heart disease in her maternal grandfather and paternal grandmother; Hypertension in her mother and son; Prostate cancer in her maternal grandfather. Allergies  Allergen Reactions  . Aspirin     Nose bleeds  . Ciprocin-Fluocin-Procin [Fluocinolone Acetonide]     RASH, SOB  . Sulfa Antibiotics     RASH, Itching      Outpatient Encounter Prescriptions as of 02/08/2016  Medication Sig  . Calcium Carbonate-Vitamin D (CALCIUM 600 + D PO) Take 1 tablet by mouth 2 (two) times daily.    . Cholecalciferol (VITAMIN D) 2000 units CAPS Take 1 capsule by mouth at bedtime.  . citalopram (CELEXA) 20 MG tablet Take 20 mg by mouth daily.    . Coenzyme Q10 (CO Q-10) 100 MG CAPS Take 1 capsule by mouth daily.  Marland Kitchen docusate sodium (COLACE) 100 MG capsule Take 100 mg by mouth 2 (two) times daily.  . enalapril (VASOTEC) 2.5 MG tablet Take 1 tablet by mouth daily. Patient taking 1/2 tablet daily  . fenofibrate micronized (LOFIBRA) 134 MG capsule Take 134 mg by mouth daily before breakfast.  . fexofenadine (ALLEGRA) 180 MG tablet Take 180 mg by mouth daily.  . fluticasone (FLONASE) 50 MCG/ACT nasal spray   . hydrochlorothiazide 25 MG  tablet Take 25 mg by mouth daily.    . Liraglutide (VICTOZA) 18 MG/3ML SOPN Inject 1.2 mg into the skin daily.  . Melatonin 10 MG TABS Take 1 tablet by mouth at bedtime.  . Multiple Vitamin (MULTIVITAMIN) tablet Take 1 tablet by mouth daily.    . pantoprazole (PROTONIX) 40 MG tablet Take 1 tablet (40 mg total) by mouth daily.  . polyethylene glycol (MIRALAX / GLYCOLAX) packet Take 17 g by mouth 2 (two) times daily.  . Probiotic Product (PROBIOTIC DAILY PO) Take by mouth.  . psyllium (METAMUCIL SMOOTH TEXTURE) 28 % packet Take 1 packet daily.  Marland Kitchen pyridOXINE (VITAMIN B-6) 100 MG tablet Take 200 mg by mouth daily.    . rosuvastatin (CRESTOR) 10 MG tablet Take 1 tablet by mouth daily.   . vitamin E 400 UNIT capsule Take 400 Units by mouth daily.  Marland Kitchen zolpidem (AMBIEN) 10 MG tablet Take 10 mg by mouth at bedtime as needed for sleep (take 1/2 tab hs).  . [DISCONTINUED] pantoprazole (PROTONIX) 40 MG tablet Take 1 tablet by mouth daily.  . [DISCONTINUED] nabumetone (RELAFEN) 500 MG tablet Take 500 mg by mouth as needed.   . [DISCONTINUED] omega-3 acid ethyl esters (LOVAZA) 1 g capsule Take 1 g by mouth 2 (two) times daily.  . [DISCONTINUED] omeprazole (PRILOSEC) 20 MG capsule 40 mg prn   No facility-administered encounter medications on file as of 02/08/2016.      REVIEW OF SYSTEMS  : All other systems reviewed and negative except where noted in the History of Present Illness.   PHYSICAL EXAM: BP 120/76   Pulse 80   Ht 5\' 6"  (1.676 m)   Wt 215 lb 6.4 oz (97.7 kg)   BMI 34.77 kg/m  General: Well developed white female in no acute distress Head: Normocephalic and atraumatic Eyes:  Sclerae anicteric, conjunctiva pink. Ears: Normal auditory acuity Lungs: Clear throughout to auscultation; no increased WOB. Heart: Regular rate and rhythm Abdomen: Soft, non-distended.  Normal bowel sounds.  Mild epigastric and RUQ TTP. Musculoskeletal: Symmetrical with no gross deformities  Skin: No lesions on visible extremities Extremities: No edema  Neurological: Alert oriented x 4, grossly non-focal Psychological:  Alert and cooperative. Normal mood and affect  ASSESSMENT AND PLAN: -51 year-old female with complaints of recurrent, episodic nausea with vomiting, bloating, and epigastric/right upper quadrant abdominal pain.  She is convinced that she has a gallbladder issue.  Last ultrasound was over 2 years ago. Has never had a HIDA scan. We will do labs today including CBC, CMP, lipase. I will schedule her for repeat ultrasound and then a HIDA scan. I'm going to have her increase her pantoprazole 40 mg twice daily in the interim. Can continue to use Zofran as needed.   CC:  [name], NP

## 2016-02-11 NOTE — Progress Notes (Signed)
Reviewed and agree with documentation and assessment and plan. K. Veena Nandigam , MD   

## 2016-02-23 ENCOUNTER — Encounter (HOSPITAL_COMMUNITY)
Admission: RE | Admit: 2016-02-23 | Discharge: 2016-02-23 | Disposition: A | Discharge status: Home / Self Care | Payer: BC Managed Care – PPO | Source: Ambulatory Visit | Attending: Internal Medicine | Admitting: Internal Medicine

## 2016-02-23 DIAGNOSIS — R140 Abdominal distension (gaseous): Secondary | ICD-10-CM | POA: Diagnosis present

## 2016-02-23 DIAGNOSIS — R1013 Epigastric pain: Secondary | ICD-10-CM

## 2016-02-23 DIAGNOSIS — R112 Nausea with vomiting, unspecified: Secondary | ICD-10-CM

## 2016-02-23 MED ORDER — TECHNETIUM TC 99M MEBROFENIN IV KIT
5.0000 | PACK | Freq: Once | INTRAVENOUS | Status: AC | PRN
  Administered 2016-02-23: 5 via INTRAVENOUS

## 2016-02-24 ENCOUNTER — Telehealth: Payer: Self-pay | Admitting: Internal Medicine

## 2016-02-25 NOTE — Telephone Encounter (Signed)
Letter mailed. Left information for her on her voicemail also.

## 2016-04-06 ENCOUNTER — Other Ambulatory Visit: Payer: Self-pay

## 2016-04-06 MED ORDER — PANTOPRAZOLE SODIUM 40 MG PO TBEC: 40.0000 mg | DELAYED_RELEASE_TABLET | Freq: Two times a day (BID) | ORAL | 0 refills | Status: DC

## 2016-06-06 ENCOUNTER — Other Ambulatory Visit: Payer: Self-pay | Admitting: Nurse Practitioner

## 2016-06-06 DIAGNOSIS — M25562 Pain in left knee: Secondary | ICD-10-CM

## 2016-06-26 ENCOUNTER — Ambulatory Visit
Admission: RE | Admit: 2016-06-26 | Discharge: 2016-06-26 | Disposition: A | Discharge status: Home / Self Care | Payer: BC Managed Care – PPO | Source: Ambulatory Visit | Attending: Nurse Practitioner | Admitting: Nurse Practitioner

## 2016-06-26 DIAGNOSIS — X58XXXA Exposure to other specified factors, initial encounter: Secondary | ICD-10-CM | POA: Insufficient documentation

## 2016-06-26 DIAGNOSIS — S83512A Sprain of anterior cruciate ligament of left knee, initial encounter: Secondary | ICD-10-CM | POA: Diagnosis not present

## 2016-06-26 DIAGNOSIS — M25562 Pain in left knee: Secondary | ICD-10-CM | POA: Diagnosis present

## 2016-06-26 DIAGNOSIS — S82102A Unspecified fracture of upper end of left tibia, initial encounter for closed fracture: Secondary | ICD-10-CM | POA: Insufficient documentation

## 2016-06-26 DIAGNOSIS — S83412A Sprain of medial collateral ligament of left knee, initial encounter: Secondary | ICD-10-CM | POA: Diagnosis not present

## 2016-07-10 ENCOUNTER — Other Ambulatory Visit: Payer: Self-pay | Admitting: Obstetrics and Gynecology

## 2016-07-10 DIAGNOSIS — Z1231 Encounter for screening mammogram for malignant neoplasm of breast: Secondary | ICD-10-CM

## 2016-07-26 ENCOUNTER — Encounter (INDEPENDENT_AMBULATORY_CARE_PROVIDER_SITE_OTHER): Payer: Self-pay

## 2016-07-26 ENCOUNTER — Ambulatory Visit
Admission: RE | Admit: 2016-07-26 | Discharge: 2016-07-26 | Disposition: A | Discharge status: Home / Self Care | Payer: BC Managed Care – PPO | Source: Ambulatory Visit | Attending: Obstetrics and Gynecology | Admitting: Obstetrics and Gynecology

## 2016-07-26 DIAGNOSIS — Z1231 Encounter for screening mammogram for malignant neoplasm of breast: Secondary | ICD-10-CM

## 2016-10-16 DIAGNOSIS — E1159 Type 2 diabetes mellitus with other circulatory complications: Secondary | ICD-10-CM | POA: Insufficient documentation

## 2016-10-16 DIAGNOSIS — E1169 Type 2 diabetes mellitus with other specified complication: Secondary | ICD-10-CM | POA: Insufficient documentation

## 2016-10-16 DIAGNOSIS — E119 Type 2 diabetes mellitus without complications: Secondary | ICD-10-CM | POA: Insufficient documentation

## 2016-10-16 DIAGNOSIS — E782 Mixed hyperlipidemia: Secondary | ICD-10-CM | POA: Insufficient documentation

## 2016-10-16 DIAGNOSIS — I152 Hypertension secondary to endocrine disorders: Secondary | ICD-10-CM | POA: Insufficient documentation

## 2017-03-08 DIAGNOSIS — M25512 Pain in left shoulder: Secondary | ICD-10-CM | POA: Insufficient documentation

## 2017-06-19 ENCOUNTER — Other Ambulatory Visit: Payer: Self-pay | Admitting: Obstetrics and Gynecology

## 2017-06-19 DIAGNOSIS — Z1231 Encounter for screening mammogram for malignant neoplasm of breast: Secondary | ICD-10-CM

## 2017-07-30 ENCOUNTER — Ambulatory Visit
Admission: RE | Admit: 2017-07-30 | Discharge: 2017-07-30 | Disposition: A | Discharge status: Home / Self Care | Payer: BC Managed Care – PPO | Source: Ambulatory Visit | Attending: Obstetrics and Gynecology | Admitting: Obstetrics and Gynecology

## 2017-07-30 DIAGNOSIS — Z1231 Encounter for screening mammogram for malignant neoplasm of breast: Secondary | ICD-10-CM

## 2018-06-20 ENCOUNTER — Other Ambulatory Visit: Payer: Self-pay | Admitting: Obstetrics and Gynecology

## 2018-06-20 DIAGNOSIS — Z9289 Personal history of other medical treatment: Secondary | ICD-10-CM

## 2018-08-09 ENCOUNTER — Ambulatory Visit: Payer: BC Managed Care – PPO

## 2018-09-04 ENCOUNTER — Other Ambulatory Visit: Payer: Self-pay

## 2018-09-04 ENCOUNTER — Ambulatory Visit
Admission: RE | Admit: 2018-09-04 | Discharge: 2018-09-04 | Disposition: A | Discharge status: Home / Self Care | Payer: BC Managed Care – PPO | Source: Ambulatory Visit | Attending: Obstetrics and Gynecology | Admitting: Obstetrics and Gynecology

## 2018-09-04 DIAGNOSIS — Z9289 Personal history of other medical treatment: Secondary | ICD-10-CM

## 2018-09-06 ENCOUNTER — Other Ambulatory Visit: Payer: Self-pay | Admitting: Obstetrics and Gynecology

## 2018-09-06 DIAGNOSIS — R928 Other abnormal and inconclusive findings on diagnostic imaging of breast: Secondary | ICD-10-CM

## 2018-09-09 ENCOUNTER — Other Ambulatory Visit: Payer: Self-pay

## 2018-09-09 ENCOUNTER — Ambulatory Visit
Admission: RE | Admit: 2018-09-09 | Discharge: 2018-09-09 | Disposition: A | Discharge status: Home / Self Care | Payer: BC Managed Care – PPO | Source: Ambulatory Visit | Attending: Obstetrics and Gynecology | Admitting: Obstetrics and Gynecology

## 2018-09-09 DIAGNOSIS — R928 Other abnormal and inconclusive findings on diagnostic imaging of breast: Secondary | ICD-10-CM

## 2018-09-12 ENCOUNTER — Other Ambulatory Visit: Payer: BC Managed Care – PPO

## 2019-08-05 ENCOUNTER — Other Ambulatory Visit: Payer: Self-pay | Admitting: Obstetrics and Gynecology

## 2019-08-05 DIAGNOSIS — Z1231 Encounter for screening mammogram for malignant neoplasm of breast: Secondary | ICD-10-CM

## 2019-09-08 ENCOUNTER — Ambulatory Visit: Payer: BC Managed Care – PPO

## 2019-09-09 ENCOUNTER — Ambulatory Visit: Payer: BC Managed Care – PPO

## 2019-09-10 ENCOUNTER — Encounter: Payer: Self-pay | Admitting: Gastroenterology

## 2019-09-10 ENCOUNTER — Other Ambulatory Visit: Payer: Self-pay

## 2019-09-10 ENCOUNTER — Ambulatory Visit
Admission: RE | Admit: 2019-09-10 | Discharge: 2019-09-10 | Disposition: A | Discharge status: Home / Self Care | Payer: BC Managed Care – PPO | Source: Ambulatory Visit | Attending: Obstetrics and Gynecology | Admitting: Obstetrics and Gynecology

## 2019-09-10 ENCOUNTER — Ambulatory Visit: Payer: BC Managed Care – PPO | Admitting: Gastroenterology

## 2019-09-10 VITALS — BP 124/70 | HR 79 | Ht 65.0 in | Wt 201.0 lb

## 2019-09-10 DIAGNOSIS — R112 Nausea with vomiting, unspecified: Secondary | ICD-10-CM

## 2019-09-10 DIAGNOSIS — Z1231 Encounter for screening mammogram for malignant neoplasm of breast: Secondary | ICD-10-CM

## 2019-09-10 DIAGNOSIS — R140 Abdominal distension (gaseous): Secondary | ICD-10-CM

## 2019-09-10 DIAGNOSIS — R197 Diarrhea, unspecified: Secondary | ICD-10-CM | POA: Diagnosis not present

## 2019-09-10 MED ORDER — ONDANSETRON 4 MG PO TBDP: 4.0000 mg | ORAL_TABLET | Freq: Four times a day (QID) | ORAL | 1 refills | Status: DC | PRN

## 2019-09-10 NOTE — Progress Notes (Signed)
09/10/2019 Audrey Hurley [id number] Jul 23, 1965   HISTORY OF PRESENT ILLNESS: This is a 54 year old female whose care has been established with Dr. Silverio Decamp.  She was last seen here in 2018 at which time we performed ultrasound and HIDA scan to rule out gallbladder issues.  She presents here today with complaints of intermittent episodes of nausea with associated vomiting as well as abdominal bloating/distention and burning sensation.  She says that diarrhea comes only with these episodes as well.  She has had about 4 or 5 of these episodes so far this year.  She says that symptoms usually last 1 to 2 days before they resolve.  She feels normal in between episodes and has normal bowel movements.  She is on pantoprazole 40 mg daily.  Has tried Tagamet as well.  Had an EGD years ago.  Her last colonoscopy was in 06/2011 by Dr. [name] at which time she was only found to have internal hemorrhoids.  She describes the symptoms as having a sour stomach and sour burps.  Says that that is how it begins and then she starts vomiting.  Never gets evaluated during acute symptoms.   Past Medical History:  Diagnosis Date  . Allergy    seasonal-takes zyrtec  . Depression   . Diabetes mellitus    taking metformin  . Fatty liver 05/03/09   seen on CT  . GERD (gastroesophageal reflux disease)   . Hyperlipidemia   . Hypertension   . Melanoma ([place])   . Sleep apnea    On CPAP  . Ureteral calculus 05/03/09   Past Surgical History:  Procedure Laterality Date  . CESAREAN SECTION  1992  . DILATION AND CURETTAGE OF UTERUS    . ENDOMETRIAL ABLATION W/ NOVASURE    . LAPAROSCOPIC HYSTERECTOMY  2007   with lysis of adhesions  . Sling cystourethropexy    . TUBAL LIGATION      reports that she has never smoked. She has never used smokeless tobacco. She reports current alcohol use. She reports that she does not use drugs. family history includes Breast cancer in her maternal aunt; Cirrhosis in her  maternal aunt; Colon polyps in her father; Heart disease in her maternal grandfather and paternal grandmother; Hypertension in her mother and son; Prostate cancer in her maternal grandfather. Allergies  Allergen Reactions  . Aspirin     Nose bleeds  . Ciprocin-Fluocin-Procin [Fluocinolone Acetonide]     RASH, SOB  . Sulfa Antibiotics     RASH, Itching      Outpatient Encounter Medications as of 09/10/2019  Medication Sig  . cetirizine (ZYRTEC) 10 MG tablet Take 10 mg by mouth daily.  . Semaglutide (OZEMPIC, 1 MG/DOSE, [place]) Inject into the skin. Once weekly on Tuesday  . Calcium Carbonate-Vitamin D (CALCIUM 600 + D PO) Take 1 tablet by mouth 2 (two) times daily.    . Cholecalciferol (VITAMIN D) 2000 units CAPS Take 1 capsule by mouth at bedtime.  . citalopram (CELEXA) 40 MG tablet Take 40 mg by mouth daily.  . Coenzyme Q10 (CO Q-10) 100 MG CAPS Take 1 capsule by mouth daily.  Marland Kitchen docusate sodium (COLACE) 100 MG capsule Take 100 mg by mouth 2 (two) times daily.  . enalapril (VASOTEC) 2.5 MG tablet Take 1 tablet by mouth daily. Patient taking 1/2 tablet daily  . fenofibrate micronized (LOFIBRA) 134 MG capsule Take 134 mg by mouth daily before breakfast.  . fluticasone (FLONASE) 50 MCG/ACT nasal spray   .  hydrochlorothiazide 25 MG tablet Take 25 mg by mouth daily.    . Melatonin 10 MG TABS Take 1 tablet by mouth at bedtime.  . Multiple Vitamin (MULTIVITAMIN) tablet Take 1 tablet by mouth daily.    . pantoprazole (PROTONIX) 40 MG tablet Take 1 tablet (40 mg total) by mouth 2 (two) times daily.  . Probiotic Product (PROBIOTIC DAILY PO) Take by mouth.  . psyllium (METAMUCIL SMOOTH TEXTURE) 28 % packet Take 1 packet daily.  Marland Kitchen pyridOXINE (VITAMIN B-6) 100 MG tablet Take 200 mg by mouth daily.    . rosuvastatin (CRESTOR) 10 MG tablet Take 1 tablet by mouth daily.  . vitamin E 400 UNIT capsule Take 400 Units by mouth daily.  . [DISCONTINUED] citalopram (CELEXA) 20 MG tablet Take 20 mg by mouth  daily.    . [DISCONTINUED] fexofenadine (ALLEGRA) 180 MG tablet Take 180 mg by mouth daily.  . [DISCONTINUED] Liraglutide (VICTOZA) 18 MG/3ML SOPN Inject 1.2 mg into the skin daily.  . [DISCONTINUED] polyethylene glycol (MIRALAX / GLYCOLAX) packet Take 17 g by mouth 2 (two) times daily.  . [DISCONTINUED] zolpidem (AMBIEN) 10 MG tablet Take 10 mg by mouth at bedtime as needed for sleep (take 1/2 tab hs).   No facility-administered encounter medications on file as of 09/10/2019.     REVIEW OF SYSTEMS  : All other systems reviewed and negative except where noted in the History of Present Illness.   PHYSICAL EXAM: BP 124/70   Pulse 79   Ht 5\' 5"  (1.651 m)   Wt 201 lb (91.2 kg)   BMI 33.45 kg/m  General: Well developed white female in no acute distress Head: Normocephalic and atraumatic Eyes:  Sclerae anicteric, conjunctiva pink. Ears: Normal auditory acuity Lungs: Clear throughout to auscultation; no increased WOB Heart: Regular rate and rhythm; no M/R/G. Abdomen: Soft, non-distended.  BS present.  Non-tender. Rectal:  Will be done at the time of colonoscopy. Musculoskeletal: Symmetrical with no gross deformities  Skin: No lesions on visible extremities Extremities: No edema  Neurological: Alert oriented x 4, grossly non-focal Psychological:  Alert and cooperative. Normal mood and affect  ASSESSMENT AND PLAN: *54 year old female with complaints of intermittent episodes of nausea and vomiting with associated abdominal bloating/distention and abdominal pain.  She gets diarrhea only during these episodes as well.  Symptoms last at most 1 to 2 days before resolving.  Feels normal between episodes.  Not really quite sure what is causing her symptoms.  She has had her gallbladder evaluated in the past.  It sounds almost like intermittent partial small bowel obstruction.  She has never been evaluated acutely during any of these episodes.  Has experienced about 4-5 episodes so far this year.   We will plan for both EGD and colonoscopy with Dr. Silverio Decamp.  If those are unrevealing then may need some type of cross-sectional imaging.  In the interim if she develops another episode then she can certainly be evaluated in the emergency room or she can call our office and we can see if she can come for a same-day abdominal x-ray to rule out obstructive pattern.  For now I have asked her to continue her pantoprazole 40 mg daily.  Can use Tagamet as needed as well.  We will also send a prescription for Zofran to have on hand to use as needed.  The risks, benefits, and alternatives to EGD and colonoscopy were discussed with the patient and she consents to proceed.    CC:  [name],  NP

## 2019-09-10 NOTE — Patient Instructions (Signed)
If you are age 54 or older, your body mass index should be between 23-30. Your Body mass index is 33.45 kg/m. If this is out of the aforementioned range listed, please consider follow up with your Primary Care Provider.  If you are age 15 or younger, your body mass index should be between 19-25. Your Body mass index is 33.45 kg/m. If this is out of the aformentioned range listed, please consider follow up with your Primary Care Provider.   We have sent the following medications to your pharmacy for you to pick up at your convenience: Zofran 4 mg ODT every 6 hours as needed.   You have been scheduled for an endoscopy and colonoscopy. Please follow the written instructions given to you at your visit today. Please pick up your prep supplies at the pharmacy within the next 1-3 days. If you use inhalers (even only as needed), please bring them with you on the day of your procedure.  Due to recent changes in healthcare laws, you may see the results of your imaging and laboratory studies on MyChart before your provider has had a chance to review them.  We understand that in some cases there may be results that are confusing or concerning to you. Not all laboratory results come back in the same time frame and the provider may be waiting for multiple results in order to interpret others.  Please give [place] 48 hours in order for your provider to thoroughly review all the results before contacting the office for clarification of your results.

## 2019-09-12 NOTE — Progress Notes (Signed)
Reviewed and agree with documentation and assessment and plan. K. Veena Antinette Keough , MD   

## 2019-11-03 ENCOUNTER — Telehealth: Payer: Self-pay | Admitting: *Deleted

## 2019-11-03 MED ORDER — SUTAB 1479-225-188 MG PO TABS: 1.0000 | ORAL_TABLET | ORAL | 0 refills | Status: DC

## 2019-11-03 NOTE — Telephone Encounter (Signed)
Patient called indicating that rx for sutab was not sent to pharmacy. Rx has now been sent.

## 2019-11-07 ENCOUNTER — Other Ambulatory Visit: Payer: Self-pay

## 2019-11-07 ENCOUNTER — Encounter: Payer: Self-pay | Admitting: Gastroenterology

## 2019-11-07 ENCOUNTER — Ambulatory Visit (AMBULATORY_SURGERY_CENTER): Payer: BC Managed Care – PPO | Admitting: Gastroenterology

## 2019-11-07 VITALS — BP 134/65 | HR 67 | Temp 97.1°F | Resp 17 | Ht 65.0 in | Wt 201.0 lb

## 2019-11-07 DIAGNOSIS — K621 Rectal polyp: Secondary | ICD-10-CM

## 2019-11-07 DIAGNOSIS — R194 Change in bowel habit: Secondary | ICD-10-CM | POA: Diagnosis not present

## 2019-11-07 DIAGNOSIS — D128 Benign neoplasm of rectum: Secondary | ICD-10-CM

## 2019-11-07 DIAGNOSIS — D123 Benign neoplasm of transverse colon: Secondary | ICD-10-CM

## 2019-11-07 DIAGNOSIS — R1084 Generalized abdominal pain: Secondary | ICD-10-CM

## 2019-11-07 DIAGNOSIS — K219 Gastro-esophageal reflux disease without esophagitis: Secondary | ICD-10-CM

## 2019-11-07 DIAGNOSIS — D129 Benign neoplasm of anus and anal canal: Secondary | ICD-10-CM

## 2019-11-07 DIAGNOSIS — R197 Diarrhea, unspecified: Secondary | ICD-10-CM

## 2019-11-07 DIAGNOSIS — D122 Benign neoplasm of ascending colon: Secondary | ICD-10-CM

## 2019-11-07 DIAGNOSIS — R112 Nausea with vomiting, unspecified: Secondary | ICD-10-CM | POA: Diagnosis not present

## 2019-11-07 DIAGNOSIS — K253 Acute gastric ulcer without hemorrhage or perforation: Secondary | ICD-10-CM

## 2019-11-07 MED ORDER — SODIUM CHLORIDE 0.9 % IV SOLN: 500.0000 mL | INTRAVENOUS | Status: DC

## 2019-11-07 NOTE — Patient Instructions (Addendum)
Findings: Polyps, Gastritis, Hemorrhoids (Handout provided)  Repeat Colonoscopy in 5-10 years  YOU HAD AN ENDOSCOPIC PROCEDURE TODAY AT [place]:   Refer to the procedure report that was given to you for any specific questions about what was found during the examination.  If the procedure report does not answer your questions, please call your gastroenterologist to clarify.  If you requested that your care partner not be given the details of your procedure findings, then the procedure report has been included in a sealed envelope for you to review at your convenience later.  YOU SHOULD EXPECT: Some feelings of bloating in the abdomen. Passage of more gas than usual.  Walking can help get rid of the air that was put into your GI tract during the procedure and reduce the bloating. If you had a lower endoscopy (such as a colonoscopy or flexible sigmoidoscopy) you may notice spotting of blood in your stool or on the toilet paper. If you underwent a bowel prep for your procedure, you may not have a normal bowel movement for a few days.  Please Note:  You might notice some irritation and congestion in your nose or some drainage.  This is from the oxygen used during your procedure.  There is no need for concern and it should clear up in a day or so.  SYMPTOMS TO REPORT IMMEDIATELY:   Following lower endoscopy (colonoscopy or flexible sigmoidoscopy):  Excessive amounts of blood in the stool  Significant tenderness or worsening of abdominal pains  Swelling of the abdomen that is new, acute  Fever of 100F or higher   Following upper endoscopy (EGD)  Vomiting of blood or coffee ground material  New chest pain or pain under the shoulder blades  Painful or persistently difficult swallowing  New shortness of breath  Fever of 100F or higher  Black, tarry-looking stools  For urgent or emergent issues, a gastroenterologist can be reached at any hour by calling [phone]. Do not  use MyChart messaging for urgent concerns.    DIET:  We do recommend a small meal at first, but then you may proceed to your regular diet.  Drink plenty of fluids but you should avoid alcoholic beverages for 24 hours.  ACTIVITY:  You should plan to take it easy for the rest of today and you should NOT DRIVE or use heavy machinery until tomorrow (because of the sedation medicines used during the test).    FOLLOW UP: Our staff will call the number listed on your records 48-72 hours following your procedure to check on you and address any questions or concerns that you may have regarding the information given to you following your procedure. If we do not reach you, we will leave a message.  We will attempt to reach you two times.  During this call, we will ask if you have developed any symptoms of COVID 19. If you develop any symptoms (ie: fever, flu-like symptoms, shortness of breath, cough etc.) before then, please call [phone].  If you test positive for Covid 19 in the 2 weeks post procedure, please call and report this information to [place].    If any biopsies were taken you will be contacted by phone or by letter within the next 1-3 weeks.  Please call us at [phone] if you have not heard about the biopsies in 3 weeks.    SIGNATURES/CONFIDENTIALITY: You and/or your care partner have signed paperwork which will be entered into your electronic medical record.  These signatures attest to the fact that that the information above on your After Visit Summary has been reviewed and is understood.  Full responsibility of the confidentiality of this discharge information lies with you and/or your care-partner.

## 2019-11-07 NOTE — Progress Notes (Signed)
Called to room to assist during endoscopic procedure.  Patient ID and intended procedure confirmed with present staff. Received instructions for my participation in the procedure from the performing physician.  

## 2019-11-07 NOTE — Op Note (Signed)
[place] Patient Name: Audrey Hurley Procedure Date: 11/07/2019 2:23 PM MRN: [id number] Endoscopist: Mauri Pole , MD Age: 54 Referring MD:  Date of Birth: 04-01-1965 Gender: Female Account #: [id number] Procedure:                Upper GI endoscopy Indications:              Nausea and persistent vomiting of unknown cause,                            GERD, Abdominal pain Medicines:                Monitored Anesthesia Care Procedure:                Pre-Anesthesia Assessment:                           - Prior to the procedure, a History and Physical                            was performed, and patient medications and                            allergies were reviewed. The patient's tolerance of                            previous anesthesia was also reviewed. The risks                            and benefits of the procedure and the sedation                            options and risks were discussed with the patient.                            All questions were answered, and informed consent                            was obtained. Prior Anticoagulants: The patient has                            taken no previous anticoagulant or antiplatelet                            agents. ASA Grade Assessment: III - A patient with                            severe systemic disease. After reviewing the risks                            and benefits, the patient was deemed in                            satisfactory condition to undergo the procedure.  After obtaining informed consent, the endoscope was                            passed under direct vision. Throughout the                            procedure, the patient's blood pressure, pulse, and                            oxygen saturations were monitored continuously. The                            Endoscope was introduced through the mouth, and                            advanced to the second part  of duodenum. The upper                            GI endoscopy was accomplished without difficulty.                            The patient tolerated the procedure well. Scope In: Scope Out: Findings:                 No gross lesions were noted in the entire esophagus.                           The Z-line was regular and was found 37 cm from the                            incisors.                           The gastroesophageal flap valve was visualized                            endoscopically and classified as Hill Grade III                            (minimal fold, loose to endoscope, hiatal hernia                            likely).                           Few cratered gastric ulcers were found in the                            gastric antrum. The largest lesion was less than                            one mm in largest dimension.                           Patchy mild inflammation characterized by  congestion (edema), erosions and erythema was found                            in the prepyloric region of the stomach and at the                            pylorus.                           The examined duodenum was normal. Complications:            No immediate complications. Estimated Blood Loss:     Estimated blood loss was minimal. Impression:               - No gross lesions in esophagus.                           - Z-line regular, 37 cm from the incisors.                           - Gastroesophageal flap valve classified as Hill                            Grade III (minimal fold, loose to endoscope, hiatal                            hernia likely).                           - Gastric ulcers.                           - Gastritis.                           - Normal examined duodenum.                           - No specimens collected. Recommendation:           - Patient has a contact number available for                            emergencies. The signs  and symptoms of potential                            delayed complications were discussed with the                            patient. Return to normal activities tomorrow.                            Written discharge instructions were provided to the                            patient.                           -  Resume previous diet.                           - Continue present medications.                           - Await pathology results.                           - See the other procedure note for documentation of                            additional recommendations. Mauri Pole, MD 11/07/2019 3:18:04 PM This report has been signed electronically.

## 2019-11-07 NOTE — Op Note (Signed)
[facility] Patient Name: Audrey Hurley Procedure Date: 11/07/2019 2:23 PM MRN: [id number] Endoscopist: Mauri Pole , MD Age: 54 Referring MD:  Date of Birth: 1965/03/19 Gender: Female Account #: [id number] Procedure:                Colonoscopy Indications:              Abdominal pain, Change in bowel habits,                            intermittent diarrhea with constipation Medicines:                Monitored Anesthesia Care Procedure:                Pre-Anesthesia Assessment:                           - Prior to the procedure, a History and Physical                            was performed, and patient medications and                            allergies were reviewed. The patient's tolerance of                            previous anesthesia was also reviewed. The risks                            and benefits of the procedure and the sedation                            options and risks were discussed with the patient.                            All questions were answered, and informed consent                            was obtained. Prior Anticoagulants: The patient has                            taken no previous anticoagulant or antiplatelet                            agents. ASA Grade Assessment: III - A patient with                            severe systemic disease. After reviewing the risks                            and benefits, the patient was deemed in                            satisfactory condition to undergo the procedure.  After obtaining informed consent, the colonoscope                            was passed under direct vision. Throughout the                            procedure, the patient's blood pressure, pulse, and                            oxygen saturations were monitored continuously. The                            Colonoscope was introduced through the anus and                            advanced to the the  terminal ileum, with                            identification of the appendiceal orifice and IC                            valve. The colonoscopy was performed without                            difficulty. The patient tolerated the procedure                            well. The quality of the bowel preparation was                            good. The terminal ileum, ileocecal valve,                            appendiceal orifice, and rectum were photographed. Scope In: 2:49:29 PM Scope Out: 3:08:02 PM Scope Withdrawal Time: 0 hours 12 minutes 11 seconds  Total Procedure Duration: 0 hours 18 minutes 33 seconds  Findings:                 The perianal and digital rectal examinations were                            normal.                           A 7 mm polyp was found in the ascending colon. The                            polyp was sessile. The polyp was removed with a                            cold snare. Resection and retrieval were complete.                           Two sessile polyps were found in the rectum and  transverse colon. The polyps were 1 to 2 mm in                            size. These polyps were removed with a cold biopsy                            forceps. Resection and retrieval were complete.                           Non-bleeding internal hemorrhoids were found during                            retroflexion. The hemorrhoids were medium-sized.                           The terminal ileum appeared normal. Complications:            No immediate complications. Estimated Blood Loss:     Estimated blood loss was minimal. Impression:               - One 7 mm polyp in the ascending colon, removed                            with a cold snare. Resected and retrieved.                           - Two 1 to 2 mm polyps in the rectum and in the                            transverse colon, removed with a cold biopsy                            forceps.  Resected and retrieved.                           - Non-bleeding internal hemorrhoids.                           - The examined portion of the ileum was normal. Recommendation:           - Patient has a contact number available for                            emergencies. The signs and symptoms of potential                            delayed complications were discussed with the                            patient. Return to normal activities tomorrow.                            Written discharge instructions were provided to the  patient.                           - Resume previous diet.                           - Continue present medications.                           - Await pathology results.                           - Repeat colonoscopy in 5-10 years for surveillance                            based on pathology results.                           - Follow up in GI office in 2-3 months, next                            available appointment Mauri Pole, MD 11/07/2019 3:21:21 PM This report has been signed electronically.

## 2019-11-07 NOTE — Progress Notes (Signed)
To PACU, VSS. Report to [email or web address] 

## 2019-11-11 ENCOUNTER — Telehealth: Payer: Self-pay | Admitting: *Deleted

## 2019-11-11 NOTE — Telephone Encounter (Signed)
  Follow up Call-  Call back number 11/07/2019  Post procedure Call Back phone  # [phone]  Permission to leave phone message Yes  Some recent data might be hidden     Patient questions:  Do you have a fever, pain , or abdominal swelling? No. Pain Score  0 *  Have you tolerated food without any problems? Yes.    Have you been able to return to your normal activities? Yes.    Do you have any questions about your discharge instructions: Diet   No. Medications  No. Follow up visit  No.  Do you have questions or concerns about your Care? No.  Actions: * If pain score is 4 or above: No action needed, pain <4.  1. Have you developed a fever since your procedure? no  2.   Have you had an respiratory symptoms (SOB or cough) since your procedure? no  3.   Have you tested positive for COVID 19 since your procedure no  4.   Have you had any family members/close contacts diagnosed with the COVID 19 since your procedure?  no   If yes to any of these questions please route to Joylene John, RN and Joella Prince, RN

## 2019-12-01 ENCOUNTER — Encounter: Payer: Self-pay | Admitting: Gastroenterology

## 2020-01-26 ENCOUNTER — Telehealth: Payer: Self-pay

## 2020-01-26 ENCOUNTER — Ambulatory Visit: Payer: BC Managed Care – PPO | Admitting: Gastroenterology

## 2020-01-26 ENCOUNTER — Other Ambulatory Visit: Payer: Self-pay

## 2020-01-26 DIAGNOSIS — R1013 Epigastric pain: Secondary | ICD-10-CM

## 2020-01-26 MED ORDER — SUCRALFATE 1 G PO TABS: ORAL_TABLET | ORAL | 2 refills | Status: DC

## 2020-01-26 MED ORDER — ONDANSETRON 4 MG PO TBDP: 4.0000 mg | ORAL_TABLET | Freq: Four times a day (QID) | ORAL | 1 refills | Status: DC | PRN

## 2020-01-26 MED ORDER — PANTOPRAZOLE SODIUM 40 MG PO TBEC: 40.0000 mg | DELAYED_RELEASE_TABLET | Freq: Two times a day (BID) | ORAL | 0 refills | Status: DC

## 2020-01-26 NOTE — Telephone Encounter (Signed)
Patient has been scheduled for a CT scan at [facility] on Thursday, 02/05/2020 at 3:30 PM, patient to arrive at 3:15 PM. NPO 4 hours prior. Pick up contrast, drink 1st bottle at 1:30 PM, drink 2nd bottle at 2:30 PM. Provided patient with the number to scheduling if she needs to reschedule.  Patient is aware of appointment and had no other concerns at the end of the call.

## 2020-01-26 NOTE — Addendum Note (Signed)
Addended by: Yevette Edwards on: 01/26/2020 04:18 PM   Modules accepted: Orders

## 2020-01-26 NOTE — Telephone Encounter (Signed)
Spoke with patient, she states that she has not been exposed to COVID that she is aware of. Discussed further recommendations per Dr. Silverio Decamp, as stated patient did report epigastric pain and would like to proceed with CT scan. Advised patient that we will add Carafate and I will refill Zofran, pt states that she needs an updated prescription for Protonix as well.

## 2020-01-26 NOTE — Telephone Encounter (Signed)
Patient arrived at office today, states that she was told to come in if she started having another "episode". I spoke with patient in person to review her symptoms. She states that she has a sour burps and a sour stomach, she reports that she started vomiting around 2:15 AM this morning and began to have watery diarrhea about 5 AM - 3 episodes of diarrhea. Patient states that she is taking Pantoprazole daily, she has not taken anything for the diarrhea. Reports epigastric pain, no fever that she has noticed, she states that she has only eaten 2 saltine crackers today, denies eating anything new. Advised patient that I will send the information to the provider for further recommendations, advised that she stay hydrated with water, Gatorade, Pedialyte, smoothies, Ensure or boost if she is not able to tolerate solid foods. Advised that she may try Imodium for diarrhea. Patient states that she lives an hour away, she will be in the area until she hears back with further recommendations. Patient states that she is unsure if she will need any imaging or labs. Please advise, thank you.

## 2020-01-26 NOTE — Telephone Encounter (Signed)
Please check if she had any covid exposure given the acuity of her symptoms. If she is having any stomach pain? If yes, will plan to schedule CT abd & pelvis to exclude any acute pathology As already advised continue to drink fluids to stay well hydrated.  H/o stomach ulcers, continue Protonix and will add carafate 1 gm before meals and at bedtime as needed Use Zofran 4mg  q8h prn Thanks

## 2020-02-05 ENCOUNTER — Other Ambulatory Visit: Payer: Self-pay

## 2020-02-05 ENCOUNTER — Ambulatory Visit (HOSPITAL_COMMUNITY)
Admission: RE | Admit: 2020-02-05 | Discharge: 2020-02-05 | Disposition: A | Discharge status: Home / Self Care | Payer: BC Managed Care – PPO | Source: Ambulatory Visit | Attending: Gastroenterology | Admitting: Gastroenterology

## 2020-02-05 DIAGNOSIS — R1013 Epigastric pain: Secondary | ICD-10-CM | POA: Diagnosis present

## 2020-02-05 LAB — POCT I-STAT CREATININE: Creatinine, Ser: 0.6 mg/dL (ref 0.44–1.00)

## 2020-02-05 MED ORDER — IOHEXOL 300 MG/ML  SOLN
100.0000 mL | Freq: Once | INTRAMUSCULAR | Status: AC | PRN
  Administered 2020-02-05: 100 mL via INTRAVENOUS

## 2020-02-24 ENCOUNTER — Telehealth: Payer: Self-pay

## 2020-02-24 ENCOUNTER — Telehealth (INDEPENDENT_AMBULATORY_CARE_PROVIDER_SITE_OTHER): Payer: BC Managed Care – PPO | Admitting: Gastroenterology

## 2020-02-24 DIAGNOSIS — R112 Nausea with vomiting, unspecified: Secondary | ICD-10-CM

## 2020-02-24 DIAGNOSIS — K760 Fatty (change of) liver, not elsewhere classified: Secondary | ICD-10-CM

## 2020-02-24 DIAGNOSIS — R140 Abdominal distension (gaseous): Secondary | ICD-10-CM | POA: Diagnosis not present

## 2020-02-24 NOTE — Telephone Encounter (Signed)
Patient has Virtual Visit today. She indicated the Nausea is improved. No spells since she saw [place] last, using Carafate,  still having some bloating. Meds and allergies confirmed

## 2020-02-24 NOTE — Progress Notes (Signed)
Audrey Hurley    [id number]    1965-09-21  Primary Care Physician:Strader, Laurita Quint, NP  Referring Physician: [name], NP [place],  [place] [address]  This service was provided via  telemedicine due to [place] 19 pandemic.  I connected with@ on 02/25/20 at 10:40 AM EST by a video enabled telemedicine application and verified that I am speaking with the correct person using two identifiers.  Patient location: Home Provider location: Office   I discussed the limitations, risks, security and privacy concerns of performing an evaluation and management service by video enabled telemedicine application and the availability of in person appointments. I also discussed with the patient that there may be a patient responsible charge related to this service. The patient expressed understanding and agreed to proceed.   The persons participating in this telemedicine service were myself and the patient    Chief complaint: Abdominal bloating, nausea HPI:  Audrey Hurley is a very pleasant 55 year old female here for follow-up visit for abdominal bloating, intermittent nausea and vomiting  She continues to have intermittent episodes of nausea and vomiting, she was previously having them less frequently once every 3 to 4 months but recently she feels she is having them on average once every 3 to 4 weeks. She starts having sour belches which needs to intense nausea and recurrent episodes of vomiting, feels sick in her stomach.  She is currently taking Metformin, her most recent A1c was 5.2. She monitors her blood sugar and mostly ranged between 90 to120  No dysphagia, melena or rectal bleeding. Denies any recent weight loss or decreased appetite.  EGD November 12, 2019: Small hiatal hernia, erosive gastritis and gastric antral ulcers.  H. pylori negative.  Colonoscopy November 07, 2019: 3 small sessile polyps removed, 2 of which were tubular adenomas.  Internal  hemorrhoids.  CT abdomen and pelvis with contrast February 05, 2020: Hepatic steatosis otherwise no acute pathology.  Outpatient Encounter Medications as of 02/24/2020  Medication Sig  . BELSOMRA 10 MG TABS Take 1 tablet by mouth at bedtime as needed.  . Calcium Carbonate-Vitamin D (CALCIUM 600 + D PO) Take 1 tablet by mouth 2 (two) times daily.  . cetirizine (ZYRTEC) 10 MG tablet Take 10 mg by mouth daily.  . Cholecalciferol (VITAMIN D) 2000 units CAPS Take 1 capsule by mouth at bedtime.  . citalopram (CELEXA) 40 MG tablet Take 40 mg by mouth daily.  . Coenzyme Q10 (CO Q-10) 100 MG CAPS Take 1 capsule by mouth daily.  Marland Kitchen docusate sodium (COLACE) 100 MG capsule Take 100 mg by mouth 2 (two) times daily.  . enalapril (VASOTEC) 2.5 MG tablet Take 1 tablet by mouth daily. Patient taking 1/2 tablet daily  . estradiol (VIVELLE-DOT) 0.0375 MG/24HR estradiol 0.0375 mg/24 hr semiweekly transdermal patch  . fenofibrate micronized (LOFIBRA) 134 MG capsule Take 134 mg by mouth daily before breakfast.  . fluticasone (FLONASE) 50 MCG/ACT nasal spray   . hydrochlorothiazide 25 MG tablet Take 25 mg by mouth daily.  . Melatonin 10 MG TABS Take 1 tablet by mouth at bedtime.  . metFORMIN (GLUCOPHAGE-XR) 500 MG 24 hr tablet metformin ER 500 mg tablet,extended release 24 hr  . Multiple Vitamin (MULTIVITAMIN) tablet Take 1 tablet by mouth daily.  Marland Kitchen omega-3 fish oil (MAXEPA) 1000 MG CAPS capsule Take by mouth.  . ondansetron (ZOFRAN ODT) 4 MG disintegrating tablet Take 1 tablet (4 mg total) by mouth every 6 (six)  hours as needed for nausea or vomiting.  . pantoprazole (PROTONIX) 40 MG tablet Take 1 tablet (40 mg total) by mouth 2 (two) times daily.  . Probiotic Product (PROBIOTIC DAILY PO) Take by mouth.  . psyllium (METAMUCIL SMOOTH TEXTURE) 28 % packet Take 1 packet daily.  Marland Kitchen pyridOXINE (VITAMIN B-6) 100 MG tablet Take 200 mg by mouth daily.  . rosuvastatin (CRESTOR) 10 MG tablet Take 1 tablet by mouth daily.  .  Semaglutide (OZEMPIC, 1 MG/DOSE, [place]) Inject into the skin. Once weekly on Tuesday  . sucralfate (CARAFATE) 1 g tablet Mix 1 tablet (1 gm total) with warm water to make a slurry four times daily - before meals and at bedtime as needed.  . vitamin E 400 UNIT capsule Take 800 Units by mouth daily.  . [DISCONTINUED] estradiol (VIVELLE-DOT) 0.0375 MG/24HR Place onto the skin.  . [DISCONTINUED] Sodium Sulfate-Mag Sulfate-KCl (SUTAB) 8060186894 MG TABS Take 1 kit by mouth as directed. MANUFACTURER CODES!! BIN: [id number] PCN: CN GROUP: [id number] MEMBER ID: [id number];DJM AS CASH;NO PRIOR AUTHORIZATION   No facility-administered encounter medications on file as of 02/24/2020.    Allergies as of 02/24/2020 - Review Complete 02/24/2020  Allergen Reaction Noted  . Aspirin  07/28/2010  . Ciprocin-fluocin-procin [fluocinolone acetonide]  07/28/2010  . Sulfa antibiotics  07/28/2010    Past Medical History:  Diagnosis Date  . Allergy    seasonal-takes zyrtec  . Depression   . Diabetes mellitus    taking metformin  . Fatty liver 55   seen on CT  . GERD (gastroesophageal reflux disease)   . Hyperlipidemia   . Hypertension   . Melanoma ([place])   . Sleep apnea    On CPAP  . Ureteral calculus 55    Past Surgical History:  Procedure Laterality Date  . CESAREAN SECTION  1992  . DILATION AND CURETTAGE OF UTERUS    . ENDOMETRIAL ABLATION W/ NOVASURE    . LAPAROSCOPIC HYSTERECTOMY  2007   with lysis of adhesions  . Sling cystourethropexy    . TUBAL LIGATION      Family History  Problem Relation Age of Onset  . Hypertension Mother   . Colon polyps Father   . Heart disease Paternal Grandmother   . Breast cancer Maternal Aunt   . Heart disease Maternal Grandfather   . Prostate cancer Maternal Grandfather   . Hypertension Son   . Cirrhosis Maternal Aunt   . Colon cancer Neg Hx   . Rectal cancer Neg Hx   . Esophageal cancer Neg Hx     Social History   Socioeconomic History  .  Marital status: Married    Spouse name: [name]   . Number of children: 2  . Years of education: 65  . Highest education level: Not on file  Occupational History  . Occupation: Environmental consultant    Comment: Sports administrator  Tobacco Use  . Smoking status: Never Smoker  . Smokeless tobacco: Never Used  Vaping Use  . Vaping Use: Never used  Substance and Sexual Activity  . Alcohol use: Yes    Comment: rare  . Drug use: No  . Sexual activity: Not on file  Other Topics Concern  . Not on file  Social History Narrative   Patient is married [name])   Patient has 2 children.    Patient is currently working full-time.    Patient is right-handed.    Patient has 12th grade education.    Social Determinants of Health  Financial Resource Strain: Not on file  Food Insecurity: Not on file  Transportation Needs: Not on file  Physical Activity: Not on file  Stress: Not on file  Social Connections: Not on file  Intimate Partner Violence: Not on file      Review of systems: Review of Systems as per HPI All other systems reviewed and are negative.   Observations/Objective:   Data Reviewed:  Reviewed labs, radiology imaging, old records and pertinent past GI work up   Assessment and Plan/Recommendations:  55 year old very pleasant female with type 2 diabetes, hypertension, hyperlipidemia with complaints of abdominal bloating, intermittent cyclical nausea and vomiting.  We will obtain 4-hour gastric emptying scan to exclude diabetes associated gastroparesis  Abdominal bloating: Advised patient to stop Metamucil and switch to soluble fiber like Benefiber 1 tablespoon daily.  Increase water intake. Stop daily probiotic If continues to have persistent symptoms, will consider lactulose breath test to exclude small intestinal bacterial overgrowth  Fatty liver: Discussed dietary changes and exercise to lose weight Will send referral to nutritionist to help establish healthy  eating habits  Return in 2 months or sooner if needed    I discussed the assessment and treatment plan with the patient. The patient was provided an opportunity to ask questions and all were answered. The patient agreed with the plan and demonstrated an understanding of the instructions.   The patient was advised to call back or seek an in-person evaluation if the symptoms worsen or if the condition fails to improve as anticipated.  I provided 30 minutes of face-to-face and non-face-to-face time during this encounter.   Harl Bowie, MD   CC: [name], NP

## 2020-02-24 NOTE — Patient Instructions (Signed)
You have been scheduled for a gastric emptying scan at [facility] on ____ at ____. Please arrive at least 15 minutes prior to your appointment for registration. Please make certain not to have anything to eat or drink after midnight the night before your test. Hold all stomach medications (ex: Zofran, phenergan, Reglan) 48 hours prior to your test. If you need to reschedule your appointment, please contact radiology scheduling at [phone]. _____________________________________________________________________ A gastric-emptying study measures how long it takes for food to move through your stomach. There are several ways to measure stomach emptying. In the most common test, you eat food that contains a small amount of radioactive material. A scanner that detects the movement of the radioactive material is placed over your abdomen to monitor the rate at which food leaves your stomach. This test normally takes about 4 hours to complete. _____________________________________________________________________  Stop probiotics and Metamucil  Switch to Benefiber - 1 tablespoon daily

## 2020-02-25 ENCOUNTER — Encounter: Payer: Self-pay | Admitting: Gastroenterology

## 2020-03-01 ENCOUNTER — Telehealth: Payer: Self-pay

## 2020-03-01 DIAGNOSIS — R112 Nausea with vomiting, unspecified: Secondary | ICD-10-CM

## 2020-03-01 NOTE — Telephone Encounter (Signed)
Spoke with patient to let her know I was going to schedule her gastric emptying scan.  Scheduled test and sent patient mychart message with the scheduling information.

## 2020-03-29 ENCOUNTER — Other Ambulatory Visit: Payer: Self-pay

## 2020-03-29 ENCOUNTER — Ambulatory Visit (HOSPITAL_COMMUNITY)
Admission: RE | Admit: 2020-03-29 | Discharge: 2020-03-29 | Disposition: A | Discharge status: Home / Self Care | Payer: BC Managed Care – PPO | Source: Ambulatory Visit | Attending: Gastroenterology | Admitting: Gastroenterology

## 2020-03-29 DIAGNOSIS — R112 Nausea with vomiting, unspecified: Secondary | ICD-10-CM | POA: Diagnosis present

## 2020-03-29 MED ORDER — TECHNETIUM TC 99M SULFUR COLLOID
2.1000 | Freq: Once | INTRAVENOUS | Status: AC | PRN
  Administered 2020-03-29: 2.1 via INTRAVENOUS

## 2020-03-30 ENCOUNTER — Telehealth: Payer: Self-pay | Admitting: Gastroenterology

## 2020-03-30 NOTE — Telephone Encounter (Signed)
Patient notified of the results of GES.  All questions answered

## 2020-05-24 DIAGNOSIS — E7800 Pure hypercholesterolemia, unspecified: Secondary | ICD-10-CM | POA: Insufficient documentation

## 2020-05-24 DIAGNOSIS — N920 Excessive and frequent menstruation with regular cycle: Secondary | ICD-10-CM | POA: Insufficient documentation

## 2020-05-24 DIAGNOSIS — Z90710 Acquired absence of both cervix and uterus: Secondary | ICD-10-CM | POA: Insufficient documentation

## 2020-05-24 DIAGNOSIS — N930 Postcoital and contact bleeding: Secondary | ICD-10-CM | POA: Insufficient documentation

## 2020-05-24 DIAGNOSIS — F32A Depression, unspecified: Secondary | ICD-10-CM | POA: Insufficient documentation

## 2020-07-28 ENCOUNTER — Telehealth: Payer: Self-pay | Admitting: *Deleted

## 2020-07-28 MED ORDER — SUCRALFATE 1 G PO TABS: ORAL_TABLET | ORAL | 2 refills | Status: DC

## 2020-07-28 NOTE — Telephone Encounter (Signed)
Sent in refills for pt of carafate today informed pt

## 2020-08-11 ENCOUNTER — Other Ambulatory Visit: Payer: Self-pay | Admitting: Obstetrics and Gynecology

## 2020-08-11 DIAGNOSIS — Z1231 Encounter for screening mammogram for malignant neoplasm of breast: Secondary | ICD-10-CM

## 2020-10-01 ENCOUNTER — Ambulatory Visit
Admission: RE | Admit: 2020-10-01 | Discharge: 2020-10-01 | Disposition: A | Discharge status: Home / Self Care | Payer: BC Managed Care – PPO | Source: Ambulatory Visit | Attending: Obstetrics and Gynecology | Admitting: Obstetrics and Gynecology

## 2020-10-01 ENCOUNTER — Other Ambulatory Visit: Payer: Self-pay

## 2020-10-01 DIAGNOSIS — Z1231 Encounter for screening mammogram for malignant neoplasm of breast: Secondary | ICD-10-CM

## 2020-11-10 ENCOUNTER — Other Ambulatory Visit: Payer: Self-pay | Admitting: Gastroenterology

## 2021-01-05 ENCOUNTER — Other Ambulatory Visit: Payer: Self-pay

## 2021-01-12 DIAGNOSIS — E119 Type 2 diabetes mellitus without complications: Secondary | ICD-10-CM | POA: Insufficient documentation

## 2021-01-13 DIAGNOSIS — R319 Hematuria, unspecified: Secondary | ICD-10-CM | POA: Insufficient documentation

## 2021-01-20 ENCOUNTER — Other Ambulatory Visit: Payer: Self-pay | Admitting: Nephrology

## 2021-01-20 DIAGNOSIS — R310 Gross hematuria: Secondary | ICD-10-CM

## 2021-02-02 ENCOUNTER — Other Ambulatory Visit: Payer: Self-pay

## 2021-02-02 ENCOUNTER — Ambulatory Visit
Admission: RE | Admit: 2021-02-02 | Discharge: 2021-02-02 | Disposition: A | Discharge status: Home / Self Care | Payer: BC Managed Care – PPO | Source: Ambulatory Visit | Attending: Nephrology | Admitting: Nephrology

## 2021-02-02 DIAGNOSIS — R310 Gross hematuria: Secondary | ICD-10-CM | POA: Diagnosis present

## 2021-02-28 ENCOUNTER — Ambulatory Visit: Payer: BC Managed Care – PPO | Admitting: Urology

## 2021-02-28 ENCOUNTER — Encounter: Payer: Self-pay | Admitting: Urology

## 2021-02-28 ENCOUNTER — Other Ambulatory Visit: Payer: Self-pay

## 2021-02-28 VITALS — BP 142/77 | HR 82 | Ht 65.0 in | Wt 201.0 lb

## 2021-02-28 DIAGNOSIS — R319 Hematuria, unspecified: Secondary | ICD-10-CM

## 2021-02-28 DIAGNOSIS — N3020 Other chronic cystitis without hematuria: Secondary | ICD-10-CM | POA: Diagnosis not present

## 2021-02-28 LAB — MICROSCOPIC EXAMINATION
RBC, Urine: NONE SEEN /hpf (ref 0–2)
WBC, UA: NONE SEEN /hpf (ref 0–5)

## 2021-02-28 LAB — URINALYSIS, COMPLETE
Bilirubin, UA: NEGATIVE
Glucose, UA: NEGATIVE
Ketones, UA: NEGATIVE
Leukocytes,UA: NEGATIVE
Nitrite, UA: NEGATIVE
Protein,UA: NEGATIVE
RBC, UA: NEGATIVE
Specific Gravity, UA: 1.01 (ref 1.005–1.030)
Urobilinogen, Ur: 0.2 mg/dL (ref 0.2–1.0)
pH, UA: 7 (ref 5.0–7.5)

## 2021-02-28 NOTE — Progress Notes (Signed)
02/28/2021 2:59 PM   Audrey Hurley 08/28/1965 [id number]  Referring provider: [name], NP [address] [place],  [place] [address]  No chief complaint on file.   HPI: I was consulted to assess the patient's 2-3 bladder infections per year.  She gets foul-smelling cloudy urine that respond temporarily to antibiotics.  No pain or frequency.  She said I performed a sling on her in approximately 2008  When she is not infected she is continent and voids every 2 3 hours and gets up at least twice at night  Recent CT scan demonstrated 2 small stones right kidney.  Both stones are 2 mm or less with no hydronephrosis.  On oral hypoglycemics.  Bowel movements normal   PMH: Past Medical History:  Diagnosis Date   Allergy    seasonal-takes zyrtec   Depression    Diabetes mellitus    taking metformin   Fatty liver 05/03/09   seen on CT   GERD (gastroesophageal reflux disease)    Hyperlipidemia    Hypertension    Melanoma ([place])    Sleep apnea    On CPAP   Ureteral calculus 05/03/09    Surgical History: Past Surgical History:  Procedure Laterality Date   CESAREAN SECTION  1992   DILATION AND CURETTAGE OF UTERUS     ENDOMETRIAL ABLATION W/ [place]  2007   with lysis of adhesions   Sling cystourethropexy     TUBAL LIGATION      Home Medications:  Allergies as of 02/28/2021       Reactions   Aspirin    Nose bleeds   Ciprocin-fluocin-procin [fluocinolone Acetonide]    RASH, SOB   Sulfa Antibiotics    RASH, Itching        Medication List        Accurate as of February 28, 2021  2:59 PM. If you have any questions, ask your nurse or doctor.          Belsomra 10 MG Tabs Generic drug: Suvorexant Take 1 tablet by mouth at bedtime as needed.   CALCIUM 600 + D PO Take 1 tablet by mouth 2 (two) times daily.   cetirizine 10 MG tablet Commonly known as: ZYRTEC Take 10 mg by mouth daily.   citalopram 40 MG  tablet Commonly known as: CELEXA Take 40 mg by mouth daily.   Co Q-10 100 MG Caps Take 1 capsule by mouth daily.   docusate sodium 100 MG capsule Commonly known as: COLACE Take 100 mg by mouth 2 (two) times daily.   enalapril 2.5 MG tablet Commonly known as: VASOTEC Take 1 tablet by mouth daily. Patient taking 1/2 tablet daily   estradiol 0.0375 MG/24HR Commonly known as: VIVELLE-DOT estradiol 0.0375 mg/24 hr semiweekly transdermal patch   fenofibrate micronized 134 MG capsule Commonly known as: LOFIBRA Take 134 mg by mouth daily before breakfast.   fluticasone 50 MCG/ACT nasal spray Commonly known as: FLONASE   hydrochlorothiazide 25 MG tablet Commonly known as: HYDRODIURIL Take 25 mg by mouth daily.   Melatonin 10 MG Tabs Take 1 tablet by mouth at bedtime.   metFORMIN 500 MG 24 hr tablet Commonly known as: GLUCOPHAGE-XR metformin ER 500 mg tablet,extended release 24 hr   multivitamin tablet Take 1 tablet by mouth daily.   omega-3 fish oil 1000 MG Caps capsule Commonly known as: MAXEPA Take by mouth.   ondansetron 4 MG disintegrating tablet Commonly known as: Zofran ODT Take  1 tablet (4 mg total) by mouth every 6 (six) hours as needed for nausea or vomiting.   OZEMPIC (1 MG/DOSE) [place] Inject into the skin. Once weekly on Tuesday   pantoprazole 40 MG tablet Commonly known as: PROTONIX Take 1 tablet (40 mg total) by mouth 2 (two) times daily.   PROBIOTIC DAILY PO Take by mouth.   psyllium 28 % packet Commonly known as: Metamucil Smooth Texture Take 1 packet daily.   pyridOXINE 100 MG tablet Commonly known as: VITAMIN B-6 Take 200 mg by mouth daily.   rosuvastatin 10 MG tablet Commonly known as: CRESTOR Take 1 tablet by mouth daily.   sucralfate 1 g tablet Commonly known as: CARAFATE Mix 1 tablet (1 gm total) with warm water to make a slurry four times daily - before meals and at bedtime as needed.   Vitamin D 50 MCG (2000 UT) Caps Take 1 capsule  by mouth at bedtime.   vitamin E 180 MG (400 UNITS) capsule Take 800 Units by mouth daily.        Allergies:  Allergies  Allergen Reactions   Aspirin     Nose bleeds   Ciprocin-Fluocin-Procin [Fluocinolone Acetonide]     RASH, SOB   Sulfa Antibiotics     RASH, Itching    Family History: Family History  Problem Relation Age of Onset   Hypertension Mother    Colon polyps Father    Heart disease Paternal Grandmother    Breast cancer Maternal Aunt    Heart disease Maternal Grandfather    Prostate cancer Maternal Grandfather    Hypertension Son    Cirrhosis Maternal Aunt    Colon cancer Neg Hx    Rectal cancer Neg Hx    Esophageal cancer Neg Hx     Social History:  reports that she has never smoked. She has never used smokeless tobacco. She reports current alcohol use. She reports that she does not use drugs.  ROS:                                        Physical Exam: There were no vitals taken for this visit.  Constitutional:  Alert and oriented, No acute distress. HEENT: [place] AT, moist mucus membranes.  Trachea midline, no masses.   Laboratory Data: Lab Results  Component Value Date   WBC 7.4 02/08/2016   HGB 12.7 02/08/2016   HCT 37.5 02/08/2016   MCV 89.1 02/08/2016   PLT 304.0 02/08/2016    Lab Results  Component Value Date   CREATININE 0.60 02/05/2020    No results found for: PSA  No results found for: TESTOSTERONE  No results found for: HGBA1C  Urinalysis    Component Value Date/Time   COLORURINE AMBER BIOCHEMICALS MAY BE AFFECTED BY COLOR (A) 05/03/2009 1307   APPEARANCEUR CLOUDY (A) 05/03/2009 1307   LABSPEC 1.026 05/03/2009 1307   PHURINE 6.0 05/03/2009 1307   GLUCOSEU NEGATIVE 05/03/2009 1307   HGBUR LARGE (A) 05/03/2009 1307   BILIRUBINUR NEGATIVE 05/03/2009 [place] 05/03/2009 1307   PROTEINUR 30 (A) 05/03/2009 1307   UROBILINOGEN 0.2 05/03/2009 1307   NITRITE NEGATIVE 05/03/2009 1307    LEUKOCYTESUR NEGATIVE 05/03/2009 1307    Pertinent Imaging: Urine negative for blood.  Urine sent for culture.  Chart reviewed  Assessment & Plan: Return for cystoscopy for bladder infections and previous sling.  Call if culture positive.  Treat infections as needed versus prophylaxis will be discussed next visit.  Pathophysiology of UTIs discussed.  We have to rule out rare complication of a sling.  I was teasing her that she does not appear to be aging  There are no diagnoses linked to this encounter.  No follow-ups on file.  Reece Packer, MD  [facility] [address] [place], [place] [address] [phone]

## 2021-02-28 NOTE — Patient Instructions (Signed)

## 2021-03-03 ENCOUNTER — Telehealth: Payer: Self-pay

## 2021-03-03 LAB — CULTURE, URINE COMPREHENSIVE

## 2021-03-03 MED ORDER — NITROFURANTOIN MONOHYD MACRO 100 MG PO CAPS: 100.0000 mg | ORAL_CAPSULE | Freq: Two times a day (BID) | ORAL | 0 refills | Status: AC

## 2021-03-03 NOTE — Telephone Encounter (Signed)
-----   Message from Bjorn Loser, MD sent at 03/03/2021 10:23 AM EST ----- Macrodantin 100 mg twice a day for 7 days ----- Message ----- From: Alvera Novel, CMA Sent: 03/03/2021   8:37 AM EST To: Bjorn Loser, MD   ----- Message ----- From: Interface, [facility] Lab Results In Sent: 02/28/2021   4:36 PM EST To: [facility]

## 2021-03-03 NOTE — Telephone Encounter (Signed)
Pt aware - Medication sent to pharmacy.  

## 2021-04-04 ENCOUNTER — Other Ambulatory Visit: Payer: Self-pay

## 2021-04-04 ENCOUNTER — Encounter: Payer: Self-pay | Admitting: Urology

## 2021-04-04 ENCOUNTER — Ambulatory Visit: Payer: BC Managed Care – PPO | Admitting: Urology

## 2021-04-04 VITALS — BP 153/74 | HR 75 | Ht 65.0 in | Wt 201.0 lb

## 2021-04-04 DIAGNOSIS — N3020 Other chronic cystitis without hematuria: Secondary | ICD-10-CM | POA: Diagnosis not present

## 2021-04-04 DIAGNOSIS — R319 Hematuria, unspecified: Secondary | ICD-10-CM | POA: Diagnosis not present

## 2021-04-04 MED ORDER — NITROFURANTOIN MONOHYD MACRO 100 MG PO CAPS: 100.0000 mg | ORAL_CAPSULE | Freq: Every day | ORAL | 11 refills | Status: DC

## 2021-04-04 NOTE — Progress Notes (Signed)
? ?04/04/2021 ?2:35 PM  ? ?Audrey Hurley Risk ?27-Jul-1965 ?[id number] ? ?Referring provider: [name], NP ?[phone] ?[place],  [place] [address] ? ?Chief Complaint  ?Patient presents with  ? Cysto  ? ? ?HPI: ?I was consulted to assess the patient's 2-3 bladder infections per year.  She gets foul-smelling cloudy urine that respond temporarily to antibiotics.  No pain or frequency.  She said I performed a sling on her in approximately 2008 ? ?When she is not infected she is continent and voids every 2 3 hours and gets up at least twice at night ? ?Recent CT scan demonstrated 2 small stones right kidney.  Both stones are 2 mm or less with no hydronephrosis.   ? ?Return for cystoscopy for bladder infections and previous sling.  Call if culture positive.  Treat infections as needed versus prophylaxis will be discussed next visit.  Pathophysiology of UTIs discussed.  We have to rule out rare complication of a sling.  I was teasing her that she does not appear to be aging ? ?Today ?Frequency stable.  Last culture positive.  ?Patient says urine is still cloudy but no burning. ?Cystoscopy: Patient underwent flexible cystoscopy.  Bladder mucosa and trigone were normal.  No foreign body in bladder or urethra.  Urine looked clear ?Urinalysis negative but sent for culture ?  ? ? ?PMH: ?Past Medical History:  ?Diagnosis Date  ? Allergy   ? seasonal-takes zyrtec  ? Depression   ? Diabetes mellitus   ? taking metformin  ? Fatty liver 05/03/09  ? seen on CT  ? GERD (gastroesophageal reflux disease)   ? Hyperlipidemia   ? Hypertension   ? Melanoma ([place])   ? Sleep apnea   ? On CPAP  ? Ureteral calculus 05/03/09  ? ? ?Surgical History: ?Past Surgical History:  ?Procedure Laterality Date  ? [place]  ? DILATION AND CURETTAGE OF UTERUS    ? ENDOMETRIAL ABLATION W/ NOVASURE    ? LAPAROSCOPIC HYSTERECTOMY  2007  ? with lysis of adhesions  ? Sling cystourethropexy    ? TUBAL LIGATION    ? ? ?Home Medications:   ?Allergies as of 04/04/2021   ? ?   Reactions  ? Aspirin   ? Nose bleeds  ? Ciprocin-fluocin-procin [fluocinolone Acetonide]   ? RASH, SOB  ? Sulfa Antibiotics   ? RASH, Itching  ? ?  ? ?  ?Medication List  ?  ? ?  ? Accurate as of April 04, 2021  2:35 PM. If you have any questions, ask your nurse or doctor.  ?  ?  ? ?  ? ?Belsomra 10 MG Tabs ?Generic drug: Suvorexant ?Take 1 tablet by mouth at bedtime as needed. ?  ?CALCIUM 600 + D PO ?Take 1 tablet by mouth 2 (two) times daily. ?  ?cetirizine 10 MG tablet ?Commonly known as: ZYRTEC ?Take 10 mg by mouth daily. ?  ?citalopram 40 MG tablet ?Commonly known as: CELEXA ?Take 40 mg by mouth daily. ?  ?Co Q-10 100 MG Caps ?Take 1 capsule by mouth daily. ?  ?docusate sodium 100 MG capsule ?Commonly known as: COLACE ?Take 100 mg by mouth 2 (two) times daily. ?  ?enalapril 2.5 MG tablet ?Commonly known as: VASOTEC ?Take 1 tablet by mouth daily. Patient taking 1/2 tablet daily ?  ?estradiol 0.0375 MG/24HR ?Commonly known as: VIVELLE-DOT ?estradiol 0.0375 mg/24 hr semiweekly transdermal patch ?  ?fenofibrate micronized 134 MG capsule ?Commonly known as: LOFIBRA ?Take 134  mg by mouth daily before breakfast. ?  ?fluticasone 50 MCG/ACT nasal spray ?Commonly known as: FLONASE ?  ?hydrochlorothiazide 25 MG tablet ?Commonly known as: HYDRODIURIL ?Take 25 mg by mouth daily. ?  ?Melatonin 10 MG Tabs ?Take 1 tablet by mouth at bedtime. ?  ?metFORMIN 500 MG 24 hr tablet ?Commonly known as: GLUCOPHAGE-XR ?metformin ER 500 mg tablet,extended release 24 hr ?  ?multivitamin tablet ?Take 1 tablet by mouth daily. ?  ?omega-3 fish oil 1000 MG Caps capsule ?Commonly known as: MAXEPA ?Take by mouth. ?  ?ondansetron 4 MG disintegrating tablet ?Commonly known as: Zofran ODT ?Take 1 tablet (4 mg total) by mouth every 6 (six) hours as needed for nausea or vomiting. ?  ?OZEMPIC (1 MG/DOSE) [place] ?Inject into the skin. Once weekly on Tuesday ?  ?pantoprazole 40 MG tablet ?Commonly known as:  PROTONIX ?Take 1 tablet (40 mg total) by mouth 2 (two) times daily. ?  ?PROBIOTIC DAILY PO ?Take by mouth. ?  ?psyllium 28 % packet ?Commonly known as: Metamucil Smooth Texture ?Take 1 packet daily. ?  ?pyridOXINE 100 MG tablet ?Commonly known as: VITAMIN B-6 ?Take 200 mg by mouth daily. ?  ?rosuvastatin 10 MG tablet ?Commonly known as: CRESTOR ?Take 1 tablet by mouth daily. ?  ?sucralfate 1 g tablet ?Commonly known as: CARAFATE ?Mix 1 tablet (1 gm total) with warm water to make a slurry four times daily - before meals and at bedtime as needed. ?  ?Vitamin D 50 MCG (2000 UT) Caps ?Take 1 capsule by mouth at bedtime. ?  ?vitamin E 180 MG (400 UNITS) capsule ?Take 800 Units by mouth daily. ?  ? ?  ? ? ?Allergies:  ?Allergies  ?Allergen Reactions  ? Aspirin   ?  Nose bleeds  ? Ciprocin-Fluocin-Procin [Fluocinolone Acetonide]   ?  RASH, SOB  ? Sulfa Antibiotics   ?  RASH, Itching  ? ? ?Family History: ?Family History  ?Problem Relation Age of Onset  ? Hypertension Mother   ? Colon polyps Father   ? Heart disease Paternal Grandmother   ? Breast cancer Maternal Aunt   ? Heart disease Maternal Grandfather   ? Prostate cancer Maternal Grandfather   ? Hypertension Son   ? Cirrhosis Maternal Aunt   ? Colon cancer Neg Hx   ? Rectal cancer Neg Hx   ? Esophageal cancer Neg Hx   ? ? ?Social History:  reports that she has never smoked. She has never used smokeless tobacco. She reports current alcohol use. She reports that she does not use drugs. ? ?ROS: ?  ? ?  ? ?  ? ?  ? ?  ? ?  ? ?  ? ?  ? ?  ? ?  ? ?  ? ?  ? ?  ? ?Physical Exam: ?There were no vitals taken for this visit.  ?Constitutional:  Alert and oriented, No acute distress. ?HEENT: [place] AT, moist mucus membranes.  Trachea midline, no masses. ? ?Laboratory Data: ?Lab Results  ?Component Value Date  ? WBC 7.4 02/08/2016  ? HGB 12.7 02/08/2016  ? HCT 37.5 02/08/2016  ? MCV 89.1 02/08/2016  ? PLT 304.0 02/08/2016  ? ? ?Lab Results  ?Component Value Date  ? CREATININE 0.60  02/05/2020  ? ? ?No results found for: PSA ? ?No results found for: TESTOSTERONE ? ?No results found for: HGBA1C ? ?Urinalysis ?   ?Component Value Date/Time  ? COLORURINE AMBER BIOCHEMICALS MAY BE AFFECTED BY COLOR (A) 05/03/2009 1307  ?  APPEARANCEUR Clear 02/28/2021 1507  ? LABSPEC 1.026 05/03/2009 1307  ? PHURINE 6.0 05/03/2009 1307  ? GLUCOSEU Negative 02/28/2021 1507  ? HGBUR LARGE (A) 05/03/2009 1307  ? BILIRUBINUR Negative 02/28/2021 1507  ? [place] NEGATIVE 05/03/2009 1307  ? PROTEINUR Negative 02/28/2021 1507  ? PROTEINUR 30 (A) 05/03/2009 1307  ? UROBILINOGEN 0.2 05/03/2009 1307  ? NITRITE Negative 02/28/2021 1507  ? NITRITE NEGATIVE 05/03/2009 1307  ? LEUKOCYTESUR Negative 02/28/2021 1507  ? ? ?Pertinent Imaging: ? ? ?Assessment & Plan: Role of prophylaxis discussed first watchful waiting and treat infections as needed.  Call if culture positive.  Patient is going to try probiotics and I will see her in 3 months on Macrodantin 100 mg 30x11.  Pathophysiology of chronic cystitis discussed and is affecting her quality life ? ?1. Hematuria, unspecified type ? ?- Urinalysis, Complete ? ? ?No follow-ups on file. ? ?Reece Packer, MD ? ?[facility] ?[address] ?[place], [place] [address] ?[phone]  ?

## 2021-04-05 LAB — URINALYSIS, COMPLETE
Bilirubin, UA: NEGATIVE
Glucose, UA: NEGATIVE
Ketones, UA: NEGATIVE
Leukocytes,UA: NEGATIVE
Nitrite, UA: NEGATIVE
Protein,UA: NEGATIVE
RBC, UA: NEGATIVE
Specific Gravity, UA: 1.01 (ref 1.005–1.030)
Urobilinogen, Ur: 0.2 mg/dL (ref 0.2–1.0)
pH, UA: 5.5 (ref 5.0–7.5)

## 2021-04-05 LAB — MICROSCOPIC EXAMINATION: Epithelial Cells (non renal): NONE SEEN /hpf (ref 0–10)

## 2021-04-07 ENCOUNTER — Encounter: Payer: Self-pay | Admitting: Urology

## 2021-04-07 LAB — CULTURE, URINE COMPREHENSIVE

## 2021-04-11 ENCOUNTER — Telehealth: Payer: Self-pay

## 2021-04-11 MED ORDER — NITROFURANTOIN MONOHYD MACRO 100 MG PO CAPS: 100.0000 mg | ORAL_CAPSULE | Freq: Two times a day (BID) | ORAL | 0 refills | Status: DC

## 2021-04-11 NOTE — Telephone Encounter (Signed)
-----   Message from Bjorn Loser, MD sent at 04/11/2021  8:13 AM EDT ----- ?Please make sure patient can take ciprofloxacin and its not an allergy ?Ciprofloxacin 250 mg twice a day for 7 days if she can ?----- Message ----- ?From: Alvera Novel, CMA ?Sent: 04/07/2021   8:14 AM EDT ?To: Bjorn Loser, MD ? ? ?----- Message ----- ?From: Interface, [facility] Lab Results In ?Sent: 04/05/2021  11:37 AM EDT ?To: [facility] ? ? ? ?

## 2021-04-11 NOTE — Telephone Encounter (Signed)
Patient has an allergy to Cipro, is there something else we can prescribe? ?

## 2021-04-11 NOTE — Telephone Encounter (Signed)
Spoke with Dr.MacDiarmid, call in macrobid '100mg'$  BID x 7 days. Then restart daily macrobid. Pt aware. Sent in to pharmacy. ?

## 2021-05-18 DIAGNOSIS — G2581 Restless legs syndrome: Secondary | ICD-10-CM | POA: Insufficient documentation

## 2021-05-18 DIAGNOSIS — F5101 Primary insomnia: Secondary | ICD-10-CM | POA: Insufficient documentation

## 2021-05-18 DIAGNOSIS — F330 Major depressive disorder, recurrent, mild: Secondary | ICD-10-CM | POA: Insufficient documentation

## 2021-05-18 DIAGNOSIS — E6609 Other obesity due to excess calories: Secondary | ICD-10-CM | POA: Insufficient documentation

## 2021-07-11 ENCOUNTER — Encounter: Payer: Self-pay | Admitting: Urology

## 2021-07-11 ENCOUNTER — Ambulatory Visit: Payer: BC Managed Care – PPO | Admitting: Urology

## 2021-07-11 VITALS — BP 157/88 | HR 73 | Ht 65.0 in | Wt 201.0 lb

## 2021-07-11 DIAGNOSIS — R319 Hematuria, unspecified: Secondary | ICD-10-CM

## 2021-07-11 DIAGNOSIS — N3020 Other chronic cystitis without hematuria: Secondary | ICD-10-CM

## 2021-07-11 LAB — URINALYSIS, COMPLETE
Bilirubin, UA: NEGATIVE
Glucose, UA: NEGATIVE
Ketones, UA: NEGATIVE
Leukocytes,UA: NEGATIVE
Nitrite, UA: NEGATIVE
Protein,UA: NEGATIVE
RBC, UA: NEGATIVE
Specific Gravity, UA: 1.02 (ref 1.005–1.030)
Urobilinogen, Ur: 1 mg/dL (ref 0.2–1.0)
pH, UA: 6.5 (ref 5.0–7.5)

## 2021-07-11 LAB — MICROSCOPIC EXAMINATION: Epithelial Cells (non renal): >10 /hpf — AB (ref 0–10)

## 2021-07-11 MED ORDER — NITROFURANTOIN MONOHYD MACRO 100 MG PO CAPS: 100.0000 mg | ORAL_CAPSULE | Freq: Every day | ORAL | 3 refills | Status: DC

## 2021-07-14 LAB — CULTURE, URINE COMPREHENSIVE

## 2021-11-16 ENCOUNTER — Other Ambulatory Visit: Payer: Self-pay | Admitting: Gastroenterology

## 2021-11-17 ENCOUNTER — Other Ambulatory Visit: Payer: Self-pay | Admitting: Family Medicine

## 2021-11-17 DIAGNOSIS — Z1231 Encounter for screening mammogram for malignant neoplasm of breast: Secondary | ICD-10-CM

## 2022-01-17 ENCOUNTER — Ambulatory Visit
Admission: RE | Admit: 2022-01-17 | Discharge: 2022-01-17 | Disposition: A | Discharge status: Home / Self Care | Payer: BC Managed Care – PPO | Source: Ambulatory Visit | Attending: Family Medicine | Admitting: Family Medicine

## 2022-01-17 DIAGNOSIS — Z1231 Encounter for screening mammogram for malignant neoplasm of breast: Secondary | ICD-10-CM

## 2022-02-13 ENCOUNTER — Other Ambulatory Visit
Admission: RE | Admit: 2022-02-13 | Discharge: 2022-02-13 | Disposition: A | Discharge status: Home / Self Care | Payer: BC Managed Care – PPO | Source: Ambulatory Visit | Attending: Pulmonary Disease | Admitting: Pulmonary Disease

## 2022-02-13 DIAGNOSIS — R0602 Shortness of breath: Secondary | ICD-10-CM | POA: Diagnosis present

## 2022-02-13 DIAGNOSIS — J45991 Cough variant asthma: Secondary | ICD-10-CM | POA: Diagnosis present

## 2022-02-13 LAB — D-DIMER, QUANTITATIVE: D-Dimer, Quant: 0.32 ug/mL-FEU (ref 0.00–0.50)

## 2022-03-07 ENCOUNTER — Ambulatory Visit: Payer: BC Managed Care – PPO | Attending: Otolaryngology

## 2022-03-07 DIAGNOSIS — I10 Essential (primary) hypertension: Secondary | ICD-10-CM | POA: Diagnosis not present

## 2022-03-07 DIAGNOSIS — E119 Type 2 diabetes mellitus without complications: Secondary | ICD-10-CM | POA: Insufficient documentation

## 2022-03-07 DIAGNOSIS — G4733 Obstructive sleep apnea (adult) (pediatric): Secondary | ICD-10-CM | POA: Diagnosis present

## 2022-03-27 ENCOUNTER — Other Ambulatory Visit: Payer: Self-pay | Admitting: *Deleted

## 2022-03-27 MED ORDER — NITROFURANTOIN MONOHYD MACRO 100 MG PO CAPS: 100.0000 mg | ORAL_CAPSULE | Freq: Every day | ORAL | 0 refills | Status: DC

## 2022-05-23 DIAGNOSIS — J4540 Moderate persistent asthma, uncomplicated: Secondary | ICD-10-CM | POA: Insufficient documentation

## 2022-07-13 ENCOUNTER — Other Ambulatory Visit: Payer: Self-pay

## 2022-07-13 NOTE — Telephone Encounter (Signed)
Message left of triage line   Macrobid verbal order given [facility]- # [phone] [id number]  Pt has an appt 7/15.

## 2022-07-17 ENCOUNTER — Ambulatory Visit: Payer: BC Managed Care – PPO | Admitting: Urology

## 2022-07-31 ENCOUNTER — Ambulatory Visit: Payer: BC Managed Care – PPO | Admitting: Urology

## 2022-07-31 VITALS — BP 146/80 | HR 74 | Ht 68.0 in | Wt 200.0 lb

## 2022-07-31 DIAGNOSIS — R319 Hematuria, unspecified: Secondary | ICD-10-CM

## 2022-07-31 DIAGNOSIS — N3020 Other chronic cystitis without hematuria: Secondary | ICD-10-CM

## 2022-07-31 DIAGNOSIS — Z87440 Personal history of urinary (tract) infections: Secondary | ICD-10-CM

## 2022-07-31 LAB — URINALYSIS, COMPLETE
Bilirubin, UA: NEGATIVE
Glucose, UA: NEGATIVE
Ketones, UA: NEGATIVE
Leukocytes,UA: NEGATIVE
Nitrite, UA: NEGATIVE
Protein,UA: NEGATIVE
RBC, UA: NEGATIVE
Specific Gravity, UA: 1.01 (ref 1.005–1.030)
Urobilinogen, Ur: 0.2 mg/dL (ref 0.2–1.0)
pH, UA: 6 (ref 5.0–7.5)

## 2022-07-31 LAB — MICROSCOPIC EXAMINATION

## 2022-07-31 MED ORDER — NITROFURANTOIN MACROCRYSTAL 100 MG PO CAPS
100.0000 mg | ORAL_CAPSULE | Freq: Every day | ORAL | 3 refills | Status: DC
Start: 2022-07-31 — End: 2023-07-13

## 2022-07-31 NOTE — Addendum Note (Signed)
Addended by: Consuella Lose on: 07/31/2022 02:58 PM   Modules accepted: Orders

## 2022-07-31 NOTE — Progress Notes (Signed)
07/31/2022 2:50 PM   Audrey Hurley [birth year] [id number]  Referring provider: Erasmo Downer, NP [address] [place],  [place] [address]  Chief Complaint  Patient presents with   Hematuria    HPI: I was consulted to assess the patient's 2-3 bladder infections per year.  She gets foul-smelling cloudy urine that respond temporarily to antibiotics.  No pain or frequency.  She said I performed a sling on her in approximately 2008  When she is not infected she is continent and voids every 2 3 hours and gets up at least twice at night  Recent CT scan demonstrated 2 small stones right kidney.  Both stones are 2 mm or less with no hydronephrosis.     Return for cystoscopy for bladder infections and previous sling.  Call if culture positive.  Treat infections as needed versus prophylaxis will be discussed next visit.  Pathophysiology of UTIs discussed.  We have to rule out rare complication of a sling.  I was teasing her that she does not appear to be aging   Last culture positive.  Patient says urine is still cloudy but no burning. Cystoscopy: Normal   Role of prophylaxis discussed first watchful waiting and treat infections as needed.  Call if culture positive.  Patient is going to try probiotics and I will see her in 3 months on Macrodantin 100 mg 30x11.  Pathophysiology of chronic cystitis discussed and is affecting her quality life   Today During the last visit her culture was positive. Infection free and very happy.  No incontinence Macrodantin 90 x 3 sent to pharmacy  Today Frequency stable.  Last week she has had some cloudy urine but no symptoms.  No breakthrough symptoms clinically.       PMH: Past Medical History:  Diagnosis Date   Allergy    seasonal-takes zyrtec   Depression    Diabetes mellitus    taking metformin   Fatty liver 05/03/09   seen on CT   GERD (gastroesophageal reflux disease)    Hyperlipidemia    Hypertension    Melanoma ([facility])    Sleep  apnea    On CPAP   Ureteral calculus 05/03/09    Surgical History: Past Surgical History:  Procedure Laterality Date   CESAREAN SECTION  1992   DILATION AND CURETTAGE OF UTERUS     ENDOMETRIAL ABLATION W/ NOVASURE     LAPAROSCOPIC HYSTERECTOMY  2007   with lysis of adhesions   Sling cystourethropexy     TUBAL LIGATION      Home Medications:  Allergies as of 07/31/2022       Reactions   Aspirin    Nose bleeds   Ciprocin-fluocin-procin [fluocinolone Acetonide]    RASH, SOB   Sulfa Antibiotics    RASH, Itching        Medication List        Accurate as of July 31, 2022  2:50 PM. If you have any questions, ask your nurse or doctor.          STOP taking these medications    enalapril 2.5 MG tablet Commonly known as: VASOTEC   ondansetron 4 MG disintegrating tablet Commonly known as: Zofran ODT   PROBIOTIC DAILY PO   sucralfate 1 g tablet Commonly known as: CARAFATE       TAKE these medications    amLODipine 2.5 MG tablet Commonly known as: NORVASC Take 1 tablet by mouth daily.   Belsomra 10 MG Tabs Generic drug: Suvorexant  Take 1 tablet by mouth at bedtime as needed.   CALCIUM 600 + D PO Take 1 tablet by mouth 2 (two) times daily.   citalopram 40 MG tablet Commonly known as: CELEXA Take 40 mg by mouth daily.   Co Q-10 100 MG Caps Take 1 capsule by mouth daily.   docusate sodium 100 MG capsule Commonly known as: COLACE Take 100 mg by mouth 2 (two) times daily.   estradiol 0.0375 MG/24HR Commonly known as: VIVELLE-DOT estradiol 0.0375 mg/24 hr semiweekly transdermal patch   fenofibrate 145 MG tablet Commonly known as: TRICOR Take 145 mg by mouth daily.   fluticasone 50 MCG/ACT nasal spray Commonly known as: FLONASE   hydrochlorothiazide 25 MG tablet Commonly known as: HYDRODIURIL Take 25 mg by mouth daily.   Melatonin 10 MG Tabs Take 1 tablet by mouth at bedtime.   metFORMIN 500 MG tablet Commonly known as: GLUCOPHAGE TAKE 2  TABLETS BY MOUTH TWICE DAILY WITH MEALS (BREAKFAST & SUPPER).   multivitamin tablet Take 1 tablet by mouth daily.   nitrofurantoin (macrocrystal-monohydrate) 100 MG capsule Commonly known as: Macrobid Take 1 capsule (100 mg total) by mouth daily.   omega-3 fish oil 1000 MG Caps capsule Commonly known as: MAXEPA Take by mouth.   Ozempic (2 MG/DOSE) 8 MG/3ML Sopn Generic drug: Semaglutide (2 MG/DOSE) Inject into the skin.   pramipexole 0.125 MG tablet Commonly known as: MIRAPEX Take by mouth.   pyridOXINE 100 MG tablet Commonly known as: VITAMIN B6 Take 200 mg by mouth daily.   rosuvastatin 10 MG tablet Commonly known as: CRESTOR Take 1 tablet by mouth daily.   Vitamin D 50 MCG (2000 UT) Caps Take 1 capsule by mouth at bedtime.   vitamin E 180 MG (400 UNITS) capsule Take 800 Units by mouth daily.        Allergies:  Allergies  Allergen Reactions   Aspirin     Nose bleeds   Ciprocin-Fluocin-Procin [Fluocinolone Acetonide]     RASH, SOB   Sulfa Antibiotics     RASH, Itching    Family History: Family History  Problem Relation Age of Onset   Breast cancer Mother    Hypertension Mother    Colon polyps Father    Breast cancer Maternal Aunt    Cirrhosis Maternal Aunt    Heart disease Maternal Grandfather    Prostate cancer Maternal Grandfather    Heart disease Paternal Grandmother    Hypertension Son    Colon cancer Neg Hx    Rectal cancer Neg Hx    Esophageal cancer Neg Hx     Social History:  reports that she has never smoked. She has never been exposed to tobacco smoke. She has never used smokeless tobacco. She reports current alcohol use. She reports that she does not use drugs.  ROS:                                        Physical Exam: BP (!) 146/80   Pulse 74   Ht 5\' 8"  (1.727 m)   Wt 90.7 kg   BMI 30.41 kg/m   Constitutional:  Alert and oriented, No acute distress. HEENT: [place] AT, moist mucus membranes.  Trachea  midline, no masses.   Laboratory Data: Lab Results  Component Value Date   WBC 7.4 02/08/2016   HGB 12.7 02/08/2016   HCT 37.5 02/08/2016   MCV 89.1 02/08/2016  PLT 304.0 02/08/2016    Lab Results  Component Value Date   CREATININE 0.60 02/05/2020    No results found for: "PSA"  No results found for: "TESTOSTERONE"  No results found for: "HGBA1C"  Urinalysis    Component Value Date/Time   COLORURINE AMBER BIOCHEMICALS MAY BE AFFECTED BY COLOR (A) 05/03/2009 1307   APPEARANCEUR Cloudy (A) 07/11/2021 1025   LABSPEC 1.026 05/03/2009 1307   PHURINE 6.0 05/03/2009 1307   GLUCOSEU Negative 07/11/2021 1025   HGBUR LARGE (A) 05/03/2009 1307   BILIRUBINUR Negative 07/11/2021 1025   KETONESUR NEGATIVE 05/03/2009 1307   PROTEINUR Negative 07/11/2021 1025   PROTEINUR 30 (A) 05/03/2009 1307   UROBILINOGEN 0.2 05/03/2009 1307   NITRITE Negative 07/11/2021 1025   NITRITE NEGATIVE 05/03/2009 1307   LEUKOCYTESUR Negative 07/11/2021 1025    Pertinent Imaging:   Assessment & Plan: Send urine for culture.  If it is positive I will treat but not change prophylaxis yet.  90 x 3 sent to pharmacy Macrodantin and see in 1 year  1. Hematuria, unspecified type  - Urinalysis, Complete   No follow-ups on file.  Martina Sinner, MD  [facility] [address] [place], [place] [address] [phone]

## 2022-08-03 LAB — CULTURE, URINE COMPREHENSIVE

## 2022-08-08 ENCOUNTER — Telehealth: Payer: Self-pay

## 2022-08-08 NOTE — Telephone Encounter (Signed)
Patient need to contact her provider GI

## 2022-08-10 ENCOUNTER — Telehealth: Payer: Self-pay

## 2022-08-10 NOTE — Telephone Encounter (Signed)
Called and made appointment for patient

## 2022-08-10 NOTE — Telephone Encounter (Signed)
Patient is calling because she states Dr. Ellan Lambert referred patient over for uncontrolled gerd we faxed referral back because she is a establish patient with [facility]. Patient states that she was going to them because of her insurance and now her insurance has change and all her providers are in [place] now. She lives 30 minutes from [place] and would rather come to  then [place]. Patient is wanting to know if you all will accept the transfer. Patient last saw the [facility] on 02/24/2020. She is getting referral refax to [place]

## 2022-09-22 ENCOUNTER — Other Ambulatory Visit: Payer: Self-pay

## 2022-09-25 NOTE — Progress Notes (Unsigned)
Celso Amy, PA-C [address]  [address]  [place], [place] [address]  Main: [phone]  Fax: [phone]   Gastroenterology Consultation  Referring Provider:     Erasmo Downer, NP Primary Care Physician:  Erasmo Downer, NP Primary Gastroenterologist:  Celso Amy, PA-C / Dr. Wyline Mood   Reason for Consultation:     Uncontrolled GERD        HPI:   Audrey Hurley is a 57 y.o. y/o female referred for consultation & management  by Erasmo Downer, NP.    Patient reports having a flareup of acid reflux and heartburn for 2 weeks.  History of GERD for many years.  She was taking Protonix 40 mg twice daily which was helping her acid reflux.  1 month ago she saw ENT Dr. Jenne Campus who performed a laryngoscopy.  She was having voice hoarseness and throat clearing.  He diagnosed her with acid reflux.  He decreased Protonix to 40 mg once daily due to concerns about long-term PPI use.  She has also been taking Tagamet OTC daily.  This treatment is not controlling her reflux.  She reports burning in her mid chest and acid coming up into her throat for 2 weeks.  She has tried multiple PPIs in the past including omeprazole, lansoprazole, Nexium, and Protonix all of which has not controlled her reflux on once daily dose.  She has not tried [employer].  Took Carafate in the past which helped and requests prescription.  She admits to drinking sodas which burns.  Denies dysphagia, nausea, vomiting, hematemesis, melena, hematochezia, or unintentional weight loss.  She denies NSAID use.  Previous patient of Dr. Lavon Paganini at [facility].  Last EGD 10/2019 showed a few cratered gastric ulcers, largest 1 mm,.  Celso Amy, PA nice to meet you certainly [facility] mild erosive gastritis.  Normal duodenum and esophagus.  Biopsies negative for H. pylori, dysplasia, and metaplasia.  Colonoscopy 10/2019 showed 3 small polyps (tubular adenoma and hyperplastic) removed and medium  nonbleeding internal hemorrhoids.  Good prep.  7-year repeat (due 10/2026).  Past Medical History:  Diagnosis Date   Allergy    seasonal-takes zyrtec   Depression    Diabetes mellitus    taking metformin   Fatty liver 05/03/09   seen on CT   GERD (gastroesophageal reflux disease)    Hyperlipidemia    Hypertension    Melanoma (HCC)    Sleep apnea    On CPAP   Ureteral calculus 05/03/09    Past Surgical History:  Procedure Laterality Date   CESAREAN SECTION  1992   DILATION AND CURETTAGE OF UTERUS     ENDOMETRIAL ABLATION W/ NOVASURE     LAPAROSCOPIC HYSTERECTOMY  2007   with lysis of adhesions   Sling cystourethropexy     TUBAL LIGATION      Prior to Admission medications   Medication Sig Start Date End Date Taking? Authorizing Provider  amLODipine (NORVASC) 2.5 MG tablet Take 1 tablet by mouth daily. 05/23/22   [provider]  BELSOMRA 10 MG TABS Take 1 tablet by mouth at bedtime as needed. 08/10/19   [provider]  Calcium Carbonate-Vitamin D (CALCIUM 600 + D PO) Take 1 tablet by mouth 2 (two) times daily.    [provider]  Cholecalciferol (VITAMIN D) 2000 units CAPS Take 1 capsule by mouth at bedtime.    [provider]  citalopram (CELEXA) 40 MG tablet Take 40 mg by mouth daily.  04/29/19   [provider]  Coenzyme Q10 (CO Q-10) 100 MG CAPS Take 1 capsule by mouth daily.    [provider]  docusate sodium (COLACE) 100 MG capsule Take 100 mg by mouth 2 (two) times daily.    [provider]  estradiol (VIVELLE-DOT) 0.0375 MG/24HR estradiol 0.0375 mg/24 hr semiweekly transdermal patch    [provider]  fenofibrate (TRICOR) 145 MG tablet Take 145 mg by mouth daily. 05/23/21   [provider]  fluticasone Aleda Grana) 50 MCG/ACT nasal spray  06/14/11   [provider]  hydrochlorothiazide 25 MG tablet Take 25 mg by mouth daily.    [provider]  hydrocortisone 2.5 % ointment  Apply topically 2 (two) times daily. to affected area(s) 09/15/22   [provider]  Melatonin 10 MG TABS Take 1 tablet by mouth at bedtime.    [provider]  metFORMIN (GLUCOPHAGE) 500 MG tablet TAKE 2 TABLETS BY MOUTH TWICE DAILY WITH MEALS (BREAKFAST & SUPPER). 06/03/21   [provider]  montelukast (SINGULAIR) 10 MG tablet Take 10 mg by mouth daily. 07/21/22   [provider]  Multiple Vitamin (MULTIVITAMIN) tablet Take 1 tablet by mouth daily.    [provider]  nitrofurantoin (MACRODANTIN) 100 MG capsule Take 1 capsule (100 mg total) by mouth daily. 07/31/22   Alfredo Martinez, MD  omega-3 fish oil (MAXEPA) 1000 MG CAPS capsule Take by mouth.    [provider]  OZEMPIC, 2 MG/DOSE, 8 MG/3ML SOPN Inject into the skin. 06/03/21   [provider]  pramipexole (MIRAPEX) 0.125 MG tablet Take by mouth. 05/16/21   [provider]  pyridOXINE (VITAMIN B-6) 100 MG tablet Take 200 mg by mouth daily.    [provider]  rosuvastatin (CRESTOR) 10 MG tablet Take 1 tablet by mouth daily. 01/27/16   [provider]  triamcinolone cream (KENALOG) 0.1 % Apply topically 2 (two) times daily. to affected area(s) 09/15/22   [provider]  vitamin E 400 UNIT capsule Take 800 Units by mouth daily.    [provider]    Family History  Problem Relation Age of Onset   Breast cancer Mother    Hypertension Mother    Colon polyps Father    Breast cancer Maternal Aunt    Cirrhosis Maternal Aunt    Heart disease Maternal Grandfather    Prostate cancer Maternal Grandfather    Heart disease Paternal Grandmother    Hypertension Son    Colon cancer Neg Hx    Rectal cancer Neg Hx    Esophageal cancer Neg Hx      Social History   Tobacco Use   Smoking status: Never    Passive exposure: Never   Smokeless tobacco: Never  Vaping Use   Vaping status: Never Used  Substance Use Topics   Alcohol use: Yes     Comment: rare   Drug use: No    Allergies as of 09/26/2022 - Review Complete 09/26/2022  Allergen Reaction Noted   Ciprofloxacin Other (See Comments), Rash, Shortness Of Breath, and Nausea And Vomiting 07/19/2015   Sulfa antibiotics Hives, Other (See Comments), Itching, and Rash 07/28/2010   Aspirin  07/28/2010   Ciprocin-fluocin-procin [fluocinolone acetonide]  07/28/2010   Enalapril Cough 05/23/2022    Review of Systems:    All systems reviewed and negative except where noted in HPI.   Physical Exam:  BP (!) 149/82   Pulse 70   Temp 97.8 F (36.6 C)  Ht 5\' 6"  (1.676 m)   Wt 202 lb 3.2 oz (91.7 kg)   BMI 32.64 kg/m  No LMP recorded. Patient has had a hysterectomy.  General:   Alert,  Well-developed, well-nourished, pleasant and cooperative in NAD Lungs:  Respirations even and unlabored.  Clear throughout to auscultation.   No wheezes, crackles, or rhonchi. No acute distress. Heart:  Regular rate and rhythm; no murmurs, clicks, rubs, or gallops. Abdomen:  Normal bowel sounds.  No bruits.  Soft, and obese without masses, hepatosplenomegaly or hernias noted.  Mild RUQ, Epigastric, and LUQ Tenderness. No lower abdominal tenderness.  No guarding or rebound tenderness.    Neurologic:  Alert and oriented x3;  grossly normal neurologically. Psych:  Alert and cooperative. Normal mood and affect.  Imaging Studies: No results found.  Assessment and Plan:   Belinda Aas is a 57 y.o. y/o female has been referred for:  GERD - Not controlled on pantoprazole, omeprazole, lansoprazole, or Nexium.  Stopped pantoprazole  Start Dexilant 60 mg 1 tablet once daily  Start Carafate 1 g 3 times daily  Take OTC Pepcid 20 Mg twice daily and Tums as needed  Aggressive Lifystyle Modification.  Avoid sodas, carbonated drinks, coffee, citrus fruits, tomatoes, spicy or acidic foods, peppermint, garlic, onions, fatty or greasy foods.  Do not eat 2 to 3 hours before bedtime.  Upper  Abdominal Pain; Mostly Epigastric Labs:  CBC, CMP, Lipase  If she is anemic, or if symptoms persist, then I will schedule an EGD  Hx Peptic Ulcer Disease  Continue to Avoid all NSAIDS  Last EGD in 2021 showed biopsies negative for H. pylori.   Follow up in 6 weeks or sooner if symptoms worsen.  Celso Amy, PA-C

## 2022-09-26 ENCOUNTER — Encounter: Payer: Self-pay | Admitting: Physician Assistant

## 2022-09-26 ENCOUNTER — Ambulatory Visit (INDEPENDENT_AMBULATORY_CARE_PROVIDER_SITE_OTHER): Payer: BC Managed Care – PPO | Admitting: Physician Assistant

## 2022-09-26 ENCOUNTER — Telehealth: Payer: Self-pay | Admitting: Physician Assistant

## 2022-09-26 VITALS — BP 149/82 | HR 70 | Temp 97.8°F | Ht 66.0 in | Wt 202.2 lb

## 2022-09-26 DIAGNOSIS — Z8711 Personal history of peptic ulcer disease: Secondary | ICD-10-CM

## 2022-09-26 DIAGNOSIS — R1084 Generalized abdominal pain: Secondary | ICD-10-CM

## 2022-09-26 DIAGNOSIS — R1013 Epigastric pain: Secondary | ICD-10-CM

## 2022-09-26 DIAGNOSIS — K219 Gastro-esophageal reflux disease without esophagitis: Secondary | ICD-10-CM | POA: Diagnosis not present

## 2022-09-26 MED ORDER — DEXLANSOPRAZOLE 60 MG PO CPDR
60.0000 mg | DELAYED_RELEASE_CAPSULE | Freq: Every day | ORAL | 3 refills | Status: DC
Start: 2022-09-26 — End: 2022-11-09

## 2022-09-26 MED ORDER — SUCRALFATE 1 G PO TABS
1.0000 g | ORAL_TABLET | Freq: Three times a day (TID) | ORAL | 5 refills | Status: AC
Start: 2022-09-26 — End: 2025-10-16

## 2022-09-26 NOTE — Telephone Encounter (Signed)
The PA has been started for [place].

## 2022-09-27 ENCOUNTER — Telehealth: Payer: Self-pay

## 2022-09-27 LAB — COMPREHENSIVE METABOLIC PANEL
ALT: 18 IU/L (ref 0–32)
AST: 22 IU/L (ref 0–40)
Albumin: 4.7 g/dL (ref 3.8–4.9)
Alkaline Phosphatase: 40 IU/L — ABNORMAL LOW (ref 44–121)
BUN/Creatinine Ratio: 15 (ref 9–23)
BUN: 9 mg/dL (ref 6–24)
Bilirubin Total: 0.8 mg/dL (ref 0.0–1.2)
CO2: 25 mmol/L (ref 20–29)
Calcium: 10.1 mg/dL (ref 8.7–10.2)
Chloride: 102 mmol/L (ref 96–106)
Creatinine, Ser: 0.61 mg/dL (ref 0.57–1.00)
Globulin, Total: 1.9 g/dL (ref 1.5–4.5)
Glucose: 101 mg/dL — ABNORMAL HIGH (ref 70–99)
Potassium: 4.4 mmol/L (ref 3.5–5.2)
Sodium: 141 mmol/L (ref 134–144)
Total Protein: 6.6 g/dL (ref 6.0–8.5)
eGFR: 104 mL/min/{1.73_m2} (ref >59)

## 2022-09-27 LAB — CBC WITH DIFFERENTIAL/PLATELET
Basophils Absolute: 0 10*3/uL (ref 0.0–0.2)
Basos: 0 %
EOS (ABSOLUTE): 0.1 10*3/uL (ref 0.0–0.4)
Eos: 2 %
Hematocrit: 39.3 % (ref 34.0–46.6)
Hemoglobin: 13.1 g/dL (ref 11.1–15.9)
Immature Grans (Abs): 0 10*3/uL (ref 0.0–0.1)
Immature Granulocytes: 0 %
Lymphocytes Absolute: 1.5 10*3/uL (ref 0.7–3.1)
Lymphs: 29 %
MCH: 30.7 pg (ref 26.6–33.0)
MCHC: 33.3 g/dL (ref 31.5–35.7)
MCV: 92 fL (ref 79–97)
Monocytes Absolute: 0.5 10*3/uL (ref 0.1–0.9)
Monocytes: 9 %
Neutrophils Absolute: 3.2 10*3/uL (ref 1.4–7.0)
Neutrophils: 60 %
Platelets: 235 10*3/uL (ref 150–450)
RBC: 4.27 x10E6/uL (ref 3.77–5.28)
RDW: 12.4 % (ref 11.7–15.4)
WBC: 5.3 10*3/uL (ref 3.4–10.8)

## 2022-09-27 LAB — LIPASE: Lipase: 45 U/L (ref 14–72)

## 2022-09-27 NOTE — Progress Notes (Signed)
Notify patient all labs are normal.  CBC, CMP, and lipase are normal.  Hemoglobin 13.1, no anemia.  Normal kidney, liver, and pancreas tests.  Continue with current plan to take Dexilant and Carafate for acid reflux. Celso Amy, PA-C

## 2022-09-27 NOTE — Telephone Encounter (Signed)
Patient notified.  Notify patient all labs are normal.  CBC, CMP, and lipase are normal.  Hemoglobin 13.1, no anemia.  Normal kidney, liver, and pancreas tests.  Continue with current plan to take Dexilant and Carafate for acid reflux.

## 2022-10-25 ENCOUNTER — Ambulatory Visit: Payer: BC Managed Care – PPO | Admitting: Gastroenterology

## 2022-11-07 DIAGNOSIS — J324 Chronic pansinusitis: Secondary | ICD-10-CM | POA: Insufficient documentation

## 2022-11-08 NOTE — Progress Notes (Signed)
Celso Amy, PA-C [address]  [address]  [place], [place] [address]  Main: [phone]  Fax: [phone]   Primary Care Physician: Erasmo Downer, NP  Primary Gastroenterologist:  Celso Amy, PA-C / Dr. Wyline Mood    CC: F/U GERD, epigastric pain, Hx PUD  HPI: Audrey Hurley is a 57 y.o. female returns for 6-week follow-up of GERD, epigastric pain, and peptic ulcer disease.  6 weeks ago she was switched from pantoprazole to Dexilant 60 mg 1 tablet daily.  Also started Carafate 1 g 2 times daily.  On this treatment she is doing very well.  She has no more epigastric pain or acid reflux.  She has not needed to take any Pepcid or Tums.  She has tried multiple other PPIs in the past including Prilosec, Prevacid, Nexium, and Protonix which did not control her reflux.  Dexilant is working very well.  Labs 09/26/2022: Normal CBC, CMP, and lipase.  Hemoglobin 13.1.   CT Abd / pelvis w/o cm 01/2021: 2 small right renal stones.  NAD.  Last EGD 10/2019 (by Dr. Lavon Paganini at [facility]) showed a few cratered gastric ulcers, largest 1 mm,mild erosive gastritis.  Normal duodenum and esophagus.  Biopsies negative for H. pylori, dysplasia, and metaplasia.   Colonoscopy 10/2019 (by Dr. Lavon Paganini at [facility]) showed 3 small polyps (tubular adenoma and hyperplastic) removed and medium nonbleeding internal hemorrhoids.  Good prep.  7-year repeat (due 10/2026).  Current Outpatient Medications  Medication Sig Dispense Refill   amLODipine (NORVASC) 2.5 MG tablet Take 1 tablet by mouth daily.     BELSOMRA 10 MG TABS Take 1 tablet by mouth at bedtime as needed.     Calcium Carbonate-Vitamin D (CALCIUM 600 + D PO) Take 1 tablet by mouth 2 (two) times daily.     Cholecalciferol (VITAMIN D) 2000 units CAPS Take 1 capsule by mouth at bedtime.     citalopram (CELEXA) 40 MG tablet Take 40 mg by mouth daily.     Coenzyme Q10 (CO Q-10) 100 MG CAPS Take 1 capsule by mouth daily.      dexlansoprazole (DEXILANT) 60 MG capsule Take 1 capsule (60 mg total) by mouth daily. 90 capsule 3   docusate sodium (COLACE) 100 MG capsule Take 100 mg by mouth 2 (two) times daily.     estradiol (VIVELLE-DOT) 0.0375 MG/24HR estradiol 0.0375 mg/24 hr semiweekly transdermal patch     fenofibrate (TRICOR) 145 MG tablet Take 145 mg by mouth daily.     fluticasone (FLONASE) 50 MCG/ACT nasal spray      hydrochlorothiazide 25 MG tablet Take 25 mg by mouth daily.     hydrocortisone 2.5 % ointment Apply topically 2 (two) times daily. to affected area(s)     metFORMIN (GLUCOPHAGE) 500 MG tablet TAKE 2 TABLETS BY MOUTH TWICE DAILY WITH MEALS (BREAKFAST & SUPPER).     montelukast (SINGULAIR) 10 MG tablet Take 10 mg by mouth daily.     Multiple Vitamin (MULTIVITAMIN) tablet Take 1 tablet by mouth daily.     nitrofurantoin (MACRODANTIN) 100 MG capsule Take 1 capsule (100 mg total) by mouth daily. 90 capsule 3   omega-3 fish oil (MAXEPA) 1000 MG CAPS capsule Take by mouth.     OZEMPIC, 2 MG/DOSE, 8 MG/3ML SOPN Inject into the skin.     pramipexole (MIRAPEX) 0.125 MG tablet Take by mouth.     predniSONE (DELTASONE) 10 MG tablet Take 10 mg by mouth daily with breakfast.  pyridOXINE (VITAMIN B-6) 100 MG tablet Take 200 mg by mouth daily.     rosuvastatin (CRESTOR) 10 MG tablet Take 1 tablet by mouth daily.     sucralfate (CARAFATE) 1 g tablet Take 1 tablet (1 g total) by mouth 3 (three) times daily before meals. 90 tablet 5   triamcinolone cream (KENALOG) 0.1 % Apply topically 2 (two) times daily. to affected area(s)     vitamin E 400 UNIT capsule Take 800 Units by mouth daily.     No current facility-administered medications for this visit.    Allergies as of 11/09/2022 - Review Complete 11/09/2022  Allergen Reaction Noted   Ciprofloxacin Other (See Comments), Rash, Shortness Of Breath, and Nausea And Vomiting 07/19/2015   Sulfa antibiotics Hives, Itching, Other (See Comments), and Rash 07/28/2010    Aspirin  07/28/2010   Ciprocin-fluocin-procin [fluocinolone acetonide]  07/28/2010   Enalapril Cough 05/23/2022    Past Medical History:  Diagnosis Date   Allergy    seasonal-takes zyrtec   Depression    Diabetes mellitus    taking metformin   Fatty liver 05/03/09   seen on CT   GERD (gastroesophageal reflux disease)    Hyperlipidemia    Hypertension    Melanoma (HCC)    Sleep apnea    On CPAP   Ureteral calculus 05/03/09    Past Surgical History:  Procedure Laterality Date   CESAREAN SECTION  1992   DILATION AND CURETTAGE OF UTERUS     ENDOMETRIAL ABLATION W/ NOVASURE     LAPAROSCOPIC HYSTERECTOMY  2007   with lysis of adhesions   Sling cystourethropexy     TUBAL LIGATION      Review of Systems:    All systems reviewed and negative except where noted in HPI.   Physical Examination:   BP (!) 140/84   Pulse 86   Temp 98.4 F (36.9 C)   Ht 5\' 5"  (1.651 m)   Wt 200 lb (90.7 kg)   BMI 33.28 kg/m   General: Well-nourished, well-developed in no acute distress.  Neuro: Alert and oriented x 3.  Grossly intact.  Psych: Alert and cooperative, normal mood and affect.  Imaging Studies: No results found.  Assessment and Plan:   Audrey Hurley is a 57 y.o. y/o female returns for follow-up of:  1.  GERD: Greatly improved and controlled on Dexilant and sucralfate. 2   History of peptic ulcer disease -asymptomatic. 3.  Last EGD in 2021 showed biopsies negative for H. Pylori 4.  Last colonoscopy in 2021: 1 small tubular adenoma, 2 small hyperplastic polyps.   Plan: -Continue Dexilant 60 mg daily -Continue sucralfate 1 g 2 times daily as needed. -7-year repeat colonoscopy will be due 10/2026.  Celso Amy, PA-C  Follow up in 1 year to refill medication or sooner if recurrent GI symptoms.

## 2022-11-09 ENCOUNTER — Ambulatory Visit: Payer: BC Managed Care – PPO | Admitting: Physician Assistant

## 2022-11-09 ENCOUNTER — Encounter: Payer: Self-pay | Admitting: Physician Assistant

## 2022-11-09 VITALS — BP 140/84 | HR 86 | Temp 98.4°F | Ht 65.0 in | Wt 200.0 lb

## 2022-11-09 DIAGNOSIS — R1013 Epigastric pain: Secondary | ICD-10-CM

## 2022-11-09 DIAGNOSIS — Z8711 Personal history of peptic ulcer disease: Secondary | ICD-10-CM

## 2022-11-09 DIAGNOSIS — R1084 Generalized abdominal pain: Secondary | ICD-10-CM

## 2022-11-09 DIAGNOSIS — Z860101 Personal history of adenomatous and serrated colon polyps: Secondary | ICD-10-CM | POA: Diagnosis not present

## 2022-11-09 DIAGNOSIS — K219 Gastro-esophageal reflux disease without esophagitis: Secondary | ICD-10-CM | POA: Diagnosis not present

## 2022-11-09 MED ORDER — DEXLANSOPRAZOLE 60 MG PO CPDR: 60.0000 mg | DELAYED_RELEASE_CAPSULE | Freq: Every day | ORAL | 3 refills | Status: DC

## 2023-01-05 ENCOUNTER — Other Ambulatory Visit: Payer: Self-pay | Admitting: Obstetrics and Gynecology

## 2023-01-05 DIAGNOSIS — Z1231 Encounter for screening mammogram for malignant neoplasm of breast: Secondary | ICD-10-CM

## 2023-01-25 ENCOUNTER — Ambulatory Visit: Payer: 59

## 2023-01-30 ENCOUNTER — Ambulatory Visit
Admission: RE | Admit: 2023-01-30 | Discharge: 2023-01-30 | Disposition: A | Discharge status: Home / Self Care | Payer: 59 | Source: Ambulatory Visit | Attending: Obstetrics and Gynecology | Admitting: Obstetrics and Gynecology

## 2023-01-30 DIAGNOSIS — Z1231 Encounter for screening mammogram for malignant neoplasm of breast: Secondary | ICD-10-CM

## 2023-02-01 ENCOUNTER — Other Ambulatory Visit: Payer: Self-pay | Admitting: Obstetrics and Gynecology

## 2023-02-01 DIAGNOSIS — N6320 Unspecified lump in the left breast, unspecified quadrant: Secondary | ICD-10-CM

## 2023-02-06 ENCOUNTER — Ambulatory Visit
Admission: RE | Admit: 2023-02-06 | Discharge: 2023-02-06 | Disposition: A | Discharge status: Home / Self Care | Payer: 59 | Source: Ambulatory Visit | Attending: Obstetrics and Gynecology | Admitting: Obstetrics and Gynecology

## 2023-02-06 DIAGNOSIS — N6320 Unspecified lump in the left breast, unspecified quadrant: Secondary | ICD-10-CM

## 2023-07-13 ENCOUNTER — Other Ambulatory Visit: Payer: Self-pay

## 2023-07-13 DIAGNOSIS — N3020 Other chronic cystitis without hematuria: Secondary | ICD-10-CM

## 2023-07-13 MED ORDER — NITROFURANTOIN MACROCRYSTAL 100 MG PO CAPS: 100.0000 mg | ORAL_CAPSULE | Freq: Every day | ORAL | 0 refills | Status: DC

## 2023-07-30 ENCOUNTER — Ambulatory Visit: Payer: Self-pay | Admitting: Urology

## 2023-07-30 VITALS — BP 152/73 | HR 80 | Ht 65.0 in | Wt 199.0 lb

## 2023-07-30 DIAGNOSIS — N3020 Other chronic cystitis without hematuria: Secondary | ICD-10-CM | POA: Diagnosis not present

## 2023-07-30 DIAGNOSIS — R319 Hematuria, unspecified: Secondary | ICD-10-CM | POA: Diagnosis not present

## 2023-07-30 LAB — URINALYSIS, COMPLETE
Bilirubin, UA: NEGATIVE
Glucose, UA: NEGATIVE
Ketones, UA: NEGATIVE
Leukocytes,UA: NEGATIVE
Nitrite, UA: NEGATIVE
Protein,UA: NEGATIVE
RBC, UA: NEGATIVE
Specific Gravity, UA: 1.01 (ref 1.005–1.030)
Urobilinogen, Ur: 0.2 mg/dL (ref 0.2–1.0)
pH, UA: 6 (ref 5.0–7.5)

## 2023-07-30 LAB — MICROSCOPIC EXAMINATION: Epithelial Cells (non renal): >10 /HPF — AB (ref 0–10)

## 2023-07-30 MED ORDER — NITROFURANTOIN MACROCRYSTAL 100 MG PO CAPS: 100.0000 mg | ORAL_CAPSULE | Freq: Every day | ORAL | 3 refills | Status: AC

## 2023-07-30 NOTE — Progress Notes (Signed)
 07/30/2023 1:52 PM   Audrey Hurley 1966-01-13 [id number]  Referring provider: Johnson Morna FALCON, NP [address] [place],  [place] [address]  Chief Complaint  Patient presents with   Follow-up    HPI: I was consulted to assess the patient's 2-3 bladder infections per year.  She gets foul-smelling cloudy urine that respond temporarily to antibiotics.  No pain or frequency.  She said I performed a sling on her in approximately 2008  When she is not infected she is continent and voids every 2 3 hours and gets up at least twice at night  Recent CT scan demonstrated 2 small stones right kidney.  Both stones are 2 mm or less with no hydronephrosis.     Return for cystoscopy for bladder infections and previous sling.  Call if culture positive.  Treat infections as needed versus prophylaxis will be discussed next visit.  Pathophysiology of UTIs discussed.  We have to rule out rare complication of a sling.  I was teasing her that she does not appear to be aging   Last culture positive.  Patient says urine is still cloudy but no burning. Cystoscopy: Normal   Role of prophylaxis discussed first watchful waiting and treat infections as needed.  Call if culture positive.  Patient is going to try probiotics and I will see her in 3 months on Macrodantin  100 mg 30x11.  Pathophysiology of chronic cystitis discussed and is affecting her quality life   Today Frequency stable.  Last week she has had some cloudy urine but no symptoms.  No breakthrough symptoms clinically.  Send urine for culture.  If it is positive I will treat but not change prophylaxis yet.  90 x 3 sent to pharmacy Macrodantin  and see in 1 year  Today Frequency stable.  Last culture negative.  No infections.  Clinically not infected     PMH: Past Medical History:  Diagnosis Date   Allergy    seasonal-takes zyrtec   Depression    Diabetes mellitus    taking metformin   Fatty liver 05/03/09   seen on CT   GERD  (gastroesophageal reflux disease)    Hyperlipidemia    Hypertension    Melanoma (HCC)    Sleep apnea    On CPAP   Ureteral calculus 05/03/09    Surgical History: Past Surgical History:  Procedure Laterality Date   BREAST BIOPSY Right 06/17/2007   CESAREAN SECTION  01/16/1990   DILATION AND CURETTAGE OF UTERUS     ENDOMETRIAL ABLATION W/ NOVASURE     LAPAROSCOPIC HYSTERECTOMY  01/16/2005   with lysis of adhesions   Sling cystourethropexy     TUBAL LIGATION      Home Medications:  Allergies as of 07/30/2023       Reactions   Ciprofloxacin Other (See Comments), Rash, Shortness Of Breath, Nausea And Vomiting   Other Reaction(s): hives SOB fever   Sulfa Antibiotics Hives, Itching, Other (See Comments), Rash   RASH, Itching   Aspirin    Nose bleeds   Ciprocin-fluocin-procin [fluocinolone Acetonide]    RASH, SOB   Enalapril Cough        Medication List        Accurate as of July 30, 2023  1:52 PM. If you have any questions, ask your nurse or doctor.          amLODipine 2.5 MG tablet Commonly known as: NORVASC Take 1 tablet by mouth daily.   Belsomra 10 MG Tabs Generic drug: Suvorexant  Take 1 tablet by mouth at bedtime as needed.   CALCIUM 600 + D PO Take 1 tablet by mouth 2 (two) times daily.   citalopram 40 MG tablet Commonly known as: CELEXA Take 40 mg by mouth daily.   Co Q-10 100 MG Caps Take 1 capsule by mouth daily.   dexlansoprazole  60 MG capsule Commonly known as: DEXILANT  Take 1 capsule (60 mg total) by mouth daily.   docusate sodium 100 MG capsule Commonly known as: COLACE Take 100 mg by mouth 2 (two) times daily.   estradiol 0.0375 MG/24HR Commonly known as: VIVELLE-DOT estradiol 0.0375 mg/24 hr semiweekly transdermal patch   fenofibrate 145 MG tablet Commonly known as: TRICOR Take 145 mg by mouth daily.   fluticasone 50 MCG/ACT nasal spray Commonly known as: FLONASE   hydrochlorothiazide 25 MG tablet Commonly known as:  HYDRODIURIL Take 25 mg by mouth daily.   hydrocortisone  2.5 % ointment Apply topically 2 (two) times daily. to affected area(s)   metFORMIN 500 MG tablet Commonly known as: GLUCOPHAGE TAKE 2 TABLETS BY MOUTH TWICE DAILY WITH MEALS (BREAKFAST & SUPPER).   montelukast 10 MG tablet Commonly known as: SINGULAIR Take 10 mg by mouth daily.   multivitamin tablet Take 1 tablet by mouth daily.   nitrofurantoin  100 MG capsule Commonly known as: Macrodantin  Take 1 capsule (100 mg total) by mouth daily.   omega-3 fish oil 1000 MG Caps capsule Commonly known as: MAXEPA Take by mouth.   Ozempic (2 MG/DOSE) 8 MG/3ML Sopn Generic drug: Semaglutide (2 MG/DOSE) Inject into the skin.   pramipexole 0.125 MG tablet Commonly known as: MIRAPEX Take by mouth.   predniSONE 10 MG tablet Commonly known as: DELTASONE Take 10 mg by mouth daily with breakfast.   pyridOXINE 100 MG tablet Commonly known as: VITAMIN B6 Take 200 mg by mouth daily.   rosuvastatin 10 MG tablet Commonly known as: CRESTOR Take 1 tablet by mouth daily.   sucralfate  1 g tablet Commonly known as: Carafate  Take 1 tablet (1 g total) by mouth 3 (three) times daily before meals.   triamcinolone cream 0.1 % Commonly known as: KENALOG Apply topically 2 (two) times daily. to affected area(s)   Vitamin D  50 MCG (2000 UT) Caps Take 1 capsule by mouth at bedtime.   vitamin E 180 MG (400 UNITS) capsule Take 800 Units by mouth daily.        Allergies:  Allergies  Allergen Reactions   Ciprofloxacin Other (See Comments), Rash, Shortness Of Breath and Nausea And Vomiting    Other Reaction(s): hives SOB  fever   Sulfa Antibiotics Hives, Itching, Other (See Comments) and Rash    RASH, Itching   Aspirin     Nose bleeds   Ciprocin-Fluocin-Procin [Fluocinolone Acetonide]     RASH, SOB   Enalapril Cough    Family History: Family History  Problem Relation Age of Onset   Breast cancer Mother 62       recurred -  metastasized to brain & sternum   Hypertension Mother    Colon polyps Father    Breast cancer Maternal Aunt 40   Cirrhosis Maternal Aunt    Heart disease Maternal Grandfather    Prostate cancer Maternal Grandfather    Heart disease Paternal Grandmother    Hypertension Son    Colon cancer Neg Hx    Rectal cancer Neg Hx    Esophageal cancer Neg Hx     Social History:  reports that she has never smoked. She has  never been exposed to tobacco smoke. She has never used smokeless tobacco. She reports current alcohol use. She reports that she does not use drugs.  ROS:                                        Physical Exam: There were no vitals taken for this visit.  Constitutional:  Alert and oriented, No acute distress. HEENT: [place] AT, moist mucus membranes.  Trachea midline, no masses.  Laboratory Data: Lab Results  Component Value Date   WBC 5.3 09/26/2022   HGB 13.1 09/26/2022   HCT 39.3 09/26/2022   MCV 92 09/26/2022   PLT 235 09/26/2022    Lab Results  Component Value Date   CREATININE 0.61 09/26/2022    No results found for: PSA  No results found for: TESTOSTERONE  No results found for: HGBA1C  Urinalysis    Component Value Date/Time   COLORURINE AMBER BIOCHEMICALS MAY BE AFFECTED BY COLOR (A) 05/03/2009 1307   APPEARANCEUR Clear 07/31/2022 1438   LABSPEC 1.026 05/03/2009 1307   PHURINE 6.0 05/03/2009 1307   GLUCOSEU Negative 07/31/2022 1438   HGBUR LARGE (A) 05/03/2009 1307   BILIRUBINUR Negative 07/31/2022 1438   KETONESUR NEGATIVE 05/03/2009 1307   PROTEINUR Negative 07/31/2022 1438   PROTEINUR 30 (A) 05/03/2009 1307   UROBILINOGEN 0.2 05/03/2009 1307   NITRITE Negative 07/31/2022 1438   NITRITE NEGATIVE 05/03/2009 1307   LEUKOCYTESUR Negative 07/31/2022 1438    Pertinent Imaging:   Assessment & Plan: Macrodantin  90 x 3 sent to pharmacy and I will see in 1 year  1. Hematuria, unspecified type (Primary)  - Urinalysis,  Complete   No follow-ups on file.  Audrey DELENA Elizabeth, MD  [facility] [address] [place], [place] [address] [phone]

## 2023-11-07 NOTE — Progress Notes (Signed)
 "     [name], PA-C [address] [place], [place]  [address] Phone: [phone]   Gastroenterology Consultation  Referring Provider:     Alla Amis, MD Primary Care Physician:  Alla Amis, MD Primary Gastroenterologist:  [name], PA-C / Dr. Gustav ALONSO Mcgee  Reason for Consultation:     Follow-up GERD and gastric ulcers        HPI:   Discussed the use of AI scribe software for clinical note transcription with the patient, who gave verbal consent to proceed. History of Present Illness Audrey Hurley is a 58 year old female with a history of stomach ulcers who presents with persistent sour stomach and indigestion.  She experiences episodes of sour stomach and sour burps approximately every two weeks, with the most recent episode occurring about a week and a half ago. Dexilant  has been effective, but she continues to have breakthrough symptoms. Certain foods, particularly beef, exacerbate her symptoms.  She has had tick bites in the past.  Never tested for alpha gal.  She uses famotidine as needed, which provides some relief, and occasionally takes Carafate  when anticipating a sour stomach. Her last upper endoscopy in 2021 showed stomach ulcers, and she was negative for H. pylori.  She has noticed her stools have become darker and more difficult to clean, with bowel movements occurring every other day and having a clay-like consistency. She reports nausea, vomiting, and diarrhea following certain meals. She also experiences indigestion after eating candy bars.  She has a history of kidney stones, with recent urine tests showing crystals but no infection. She has not experienced pain from the stones yet. A CT scan in January 2023 did not show gallstones, but she recalls having her gallbladder checked years ago at the hospital. She has noticed her urine being cloudy and dark, but recent tests ruled out a kidney or urinary tract infection.  She is concerned  about possible gallstones.  I last saw patient at [facility] 10/2022.  History of GERD, epigastric pain, peptic ulcer disease, and adenomatous colon polyps.  Treated with Dexilant  60 mg daily and Carafate  1 g twice daily with benefit.  Previously tried and failed pantoprazole , Prilosec, Prevacid, and Nexium.  Dexilant  has worked the best.   CT Abd / pelvis w/o cm 01/2021: 2 small right renal stones.  NAD.   EGD 10/2019 (by Dr. Mcgee at [facility]) showed a few cratered gastric ulcers, largest 1 mm,mild erosive gastritis.  Normal duodenum and esophagus.  Biopsies negative for H. pylori, dysplasia, and metaplasia.   Colonoscopy 10/2019 (by Dr. Mcgee at [facility]) showed 3 small polyps (tubular adenoma and hyperplastic) removed and medium nonbleeding internal hemorrhoids.  Good prep.  7-year repeat (due 10/2026).  Past Medical History:  Diagnosis Date   Allergy    seasonal-takes zyrtec   Depression    Diabetes mellitus    taking metformin   Fatty liver 05/03/2009   seen on CT   GERD (gastroesophageal reflux disease)    Hyperlipidemia    Hypertension    Kidney stones    Melanoma (HCC)    Sleep apnea    On CPAP   Ureteral calculus 05/03/2009    Past Surgical History:  Procedure Laterality Date   BREAST BIOPSY Right 06/17/2007   CESAREAN SECTION  01/16/1990   DILATION AND CURETTAGE OF UTERUS     ENDOMETRIAL ABLATION W/ NOVASURE     LAPAROSCOPIC HYSTERECTOMY  01/16/2005   with lysis of adhesions  Sling cystourethropexy     TUBAL LIGATION      Prior to Admission medications   Medication Sig Start Date End Date Taking? Authorizing Provider  amLODipine (NORVASC) 2.5 MG tablet Take 1 tablet by mouth daily. 05/23/22   [provider]  BELSOMRA 10 MG TABS Take 1 tablet by mouth at bedtime as needed. 08/10/19   [provider]  Calcium Carbonate-Vitamin D  (CALCIUM 600 + D PO) Take 1 tablet by mouth 2 (two) times daily.    [provider]   Cholecalciferol (VITAMIN D ) 2000 units CAPS Take 1 capsule by mouth at bedtime.    [provider]  citalopram (CELEXA) 40 MG tablet Take 40 mg by mouth daily. 04/29/19   [provider]  Coenzyme Q10 (CO Q-10) 100 MG CAPS Take 1 capsule by mouth daily.    [provider]  dexlansoprazole  (DEXILANT ) 60 MG capsule Take 1 capsule (60 mg total) by mouth daily. 11/09/22 11/09/23  [place], PA-C  docusate sodium (COLACE) 100 MG capsule Take 100 mg by mouth 2 (two) times daily. Patient not taking: Reported on 07/30/2023    [provider]  estradiol (VIVELLE-DOT) 0.0375 MG/24HR estradiol 0.0375 mg/24 hr semiweekly transdermal patch    [provider]  fenofibrate (TRICOR) 145 MG tablet Take 145 mg by mouth daily. 05/23/21   [provider]  fluticasone OREN) 50 MCG/ACT nasal spray  06/14/11   [provider]  hydrochlorothiazide 25 MG tablet Take 25 mg by mouth daily.    [provider]  hydrocortisone  2.5 % ointment Apply topically 2 (two) times daily. to affected area(s) 09/15/22   [provider]  metFORMIN (GLUCOPHAGE) 500 MG tablet TAKE 2 TABLETS BY MOUTH TWICE DAILY WITH MEALS (BREAKFAST & SUPPER). 06/03/21   [provider]  montelukast (SINGULAIR) 10 MG tablet Take 10 mg by mouth daily. 07/21/22   [provider]  Multiple Vitamin (MULTIVITAMIN) tablet Take 1 tablet by mouth daily.    [provider]  nitrofurantoin  (MACRODANTIN ) 100 MG capsule Take 1 capsule (100 mg total) by mouth daily. 07/30/23   MacDiarmid, Scott, MD  omega-3 fish oil (MAXEPA) 1000 MG CAPS capsule Take by mouth.    [provider]  OZEMPIC, 2 MG/DOSE, 8 MG/3ML SOPN Inject into the skin. 06/03/21   [provider]  pramipexole (MIRAPEX) 0.125 MG tablet Take by mouth. 05/16/21   [provider]  predniSONE (DELTASONE) 10 MG tablet Take 10 mg by mouth daily with breakfast.    [provider]  pyridOXINE (VITAMIN B-6) 100 MG tablet Take 200 mg by mouth daily.    [provider]  rosuvastatin (CRESTOR) 10 MG tablet Take 1 tablet by mouth daily. 01/27/16   [provider]  sucralfate  (CARAFATE ) 1 g tablet Take 1 tablet (1 g total) by mouth 3 (three) times daily before meals. 09/26/22 07/30/23  [place], PA-C  triamcinolone cream (KENALOG) 0.1 % Apply topically 2 (two) times daily. to affected area(s) 09/15/22   [provider]  vitamin E 400 UNIT capsule Take 800 Units by mouth daily.    [provider]    Family History  Problem Relation Age of Onset   Breast cancer Mother 68       recurred - metastasized to brain & sternum   Hypertension Mother    Colon polyps Father    Breast cancer Maternal Aunt 40   Cirrhosis Maternal Aunt    Heart disease Maternal Grandfather  Prostate cancer Maternal Grandfather    Heart disease Paternal Grandmother    Hypertension Son    Colon cancer Neg Hx    Rectal cancer Neg Hx    Esophageal cancer Neg Hx      Social History   Tobacco Use   Smoking status: Never    Passive exposure: Never   Smokeless tobacco: Never  Vaping Use   Vaping status: Never Used  Substance Use Topics   Alcohol use: Yes    Comment: rare   Drug use: No    Allergies as of 11/08/2023 - Review Complete 11/08/2023  Allergen Reaction Noted   Ciprofloxacin Other (See Comments), Rash, Shortness Of Breath, and Nausea And Vomiting 07/19/2015   Sulfa antibiotics Hives, Itching, Other (See Comments), and Rash 07/28/2010   Aspirin  07/28/2010   Ciprocin-fluocin-procin [fluocinolone acetonide]  07/28/2010   Enalapril Cough 05/23/2022    Review of Systems:    All systems reviewed and negative except where noted in HPI.   Physical Exam:  BP 136/72   Pulse 77   Ht 5' 5 (1.651 m)   Wt 195 lb 2 oz (88.5 kg)   BMI 32.47 kg/m  No LMP recorded. Patient has had a hysterectomy.  General:   Alert,  Well-developed,  well-nourished, pleasant and cooperative in NAD Lungs:  Respirations even and unlabored.  Clear throughout to auscultation.   No wheezes, crackles, or rhonchi. No acute distress. Heart:  Regular rate and rhythm; no murmurs, clicks, rubs, or gallops. Abdomen:  Normal bowel sounds.  No bruits.  Soft, and non-distended without masses, hepatosplenomegaly or hernias noted.  Mild to moderate RUQ and epigastric tenderness.  No lower abdominal tenderness.  No guarding or rebound tenderness.    Neurologic:  Alert and oriented x3;  grossly normal neurologically. Psych:  Alert and cooperative. Normal mood and affect.   Imaging Studies: No results found.  Labs: 07/06/2023: Normal CBC, CMP.  CBC    Component Value Date/Time   WBC 5.3 09/26/2022 1034   WBC 7.4 02/08/2016 1617   RBC 4.27 09/26/2022 1034   RBC 4.21 02/08/2016 1617   HGB 13.1 09/26/2022 1034   HCT 39.3 09/26/2022 1034   PLT 235 09/26/2022 1034   MCV 92 09/26/2022 1034    CMP     Component Value Date/Time   NA 141 09/26/2022 1034   K 4.4 09/26/2022 1034   CL 102 09/26/2022 1034   CO2 25 09/26/2022 1034   GLUCOSE 101 (H) 09/26/2022 1034   GLUCOSE 110 (H) 02/08/2016 1617   BUN 9 09/26/2022 1034   CREATININE 0.61 09/26/2022 1034   CALCIUM 10.1 09/26/2022 1034   PROT 6.6 09/26/2022 1034   ALBUMIN 4.7 09/26/2022 1034   AST 22 09/26/2022 1034   ALT 18 09/26/2022 1034   ALKPHOS 40 (L) 09/26/2022 1034   BILITOT 0.8 09/26/2022 1034    Assessment and Plan:   Garlene Apperson is a 58 y.o. y/o female has been referred for:  1.  Chronic GERD and history of stomach ulcers (H. pylori negative).GERD and dyspepsia symptoms persist despite Dexilant . She has tried and failed multiple other PPIs.  Previous endoscopy in 2021 showed stomach ulcers, negative for H. pylori.   - Continue Dexilant  60 mg daily and Carafate  as needed. - Continue avoiding GERD trigger foods and drinks - Avoid NSAIDs - Schedule repeat EGD to evaluate for  persistent gastric ulcers Scheduling EGD I discussed risks of EGD with patient to include risk of bleeding, perforation, and  risk of sedation.  Patient expressed understanding and agrees to proceed with EGD.  - Depending on EGD results, and if GI symptoms persist, consider switching from Dexilant  toVoquezna in the future.  2.  Intermittent nausea, vomiting, RUQ and epigastric pain with occasional loose stools.  Evaluate for cholelithiasis, alpha gal, celiac.  I am ordering labs and RUQ ultrasound for further evaluation of persistent upper GI symptoms. - Labs: CBC, CMP, lipase, alpha gal, celiac screen. - Order right upper quadrant ultrasound. - Consider HIDA scan if ultrasound is negative and symptoms persist.  3.  History of adenomatous colon polyps - 7-year repeat colonoscopy will be due 10/2026    Follow up 4 weeks after EGD with TG.  [name], PA-C   "

## 2023-11-08 ENCOUNTER — Encounter: Payer: Self-pay | Admitting: Physician Assistant

## 2023-11-08 ENCOUNTER — Ambulatory Visit: Admitting: Physician Assistant

## 2023-11-08 ENCOUNTER — Other Ambulatory Visit (INDEPENDENT_AMBULATORY_CARE_PROVIDER_SITE_OTHER)

## 2023-11-08 ENCOUNTER — Encounter: Payer: Self-pay | Admitting: Gastroenterology

## 2023-11-08 VITALS — BP 136/72 | HR 77 | Ht 65.0 in | Wt 195.1 lb

## 2023-11-08 DIAGNOSIS — R112 Nausea with vomiting, unspecified: Secondary | ICD-10-CM | POA: Diagnosis not present

## 2023-11-08 DIAGNOSIS — R1013 Epigastric pain: Secondary | ICD-10-CM | POA: Diagnosis not present

## 2023-11-08 DIAGNOSIS — R1011 Right upper quadrant pain: Secondary | ICD-10-CM

## 2023-11-08 DIAGNOSIS — K219 Gastro-esophageal reflux disease without esophagitis: Secondary | ICD-10-CM

## 2023-11-08 DIAGNOSIS — R195 Other fecal abnormalities: Secondary | ICD-10-CM

## 2023-11-08 LAB — CBC WITH DIFFERENTIAL/PLATELET
Basophils Absolute: 0 K/uL (ref 0.0–0.1)
Basophils Relative: 0.4 % (ref 0.0–3.0)
Eosinophils Absolute: 0.1 K/uL (ref 0.0–0.7)
Eosinophils Relative: 1.9 % (ref 0.0–5.0)
HCT: 41.1 % (ref 36.0–46.0)
Hemoglobin: 13.4 g/dL (ref 12.0–15.0)
Lymphocytes Relative: 27.6 % (ref 12.0–46.0)
Lymphs Abs: 1.5 K/uL (ref 0.7–4.0)
MCHC: 32.7 g/dL (ref 30.0–36.0)
MCV: 90.2 fl (ref 78.0–100.0)
Monocytes Absolute: 0.5 K/uL (ref 0.1–1.0)
Monocytes Relative: 9.3 % (ref 3.0–12.0)
Neutro Abs: 3.3 K/uL (ref 1.4–7.7)
Neutrophils Relative %: 60.8 % (ref 43.0–77.0)
Platelets: 232 K/uL (ref 150.0–400.0)
RBC: 4.55 Mil/uL (ref 3.87–5.11)
RDW: 13 % (ref 11.5–15.5)
WBC: 5.4 K/uL (ref 4.0–10.5)

## 2023-11-08 LAB — COMPREHENSIVE METABOLIC PANEL WITH GFR
ALT: 18 U/L (ref 0–35)
AST: 20 U/L (ref 0–37)
Albumin: 4.7 g/dL (ref 3.5–5.2)
Alkaline Phosphatase: 43 U/L (ref 39–117)
BUN: 10 mg/dL (ref 6–23)
CO2: 30 meq/L (ref 19–32)
Calcium: 9.8 mg/dL (ref 8.4–10.5)
Chloride: 102 meq/L (ref 96–112)
Creatinine, Ser: 0.49 mg/dL (ref 0.40–1.20)
GFR: 103.72 mL/min (ref >60.00)
Glucose, Bld: 98 mg/dL (ref 70–99)
Potassium: 4.2 meq/L (ref 3.5–5.1)
Sodium: 140 meq/L (ref 135–145)
Total Bilirubin: 0.6 mg/dL (ref 0.2–1.2)
Total Protein: 6.8 g/dL (ref 6.0–8.3)

## 2023-11-08 LAB — LIPASE: Lipase: 40 U/L (ref 11.0–59.0)

## 2023-11-08 NOTE — Patient Instructions (Addendum)
 Your provider has requested that you go to the basement level for lab work before leaving today. Press B on the elevator. The lab is located at the first door on the left as you exit the elevator.  Due to recent changes in healthcare laws, you may see the results of your imaging and laboratory studies on MyChart before your provider has had a chance to review them.  We understand that in some cases there may be results that are confusing or concerning to you. Not all laboratory results come back in the same time frame and the provider may be waiting for multiple results in order to interpret others.  Please give us  48 hours in order for your provider to thoroughly review all the results before contacting the office for clarification of your results.    You have been scheduled for an abdominal ultrasound at [facility] (1st floor of hospital) on Monday, 11/12/23 at 4:30 pm. Please arrive 30 minutes prior to your appointment for registration. Make certain not to have anything to eat or drink 6 hours prior to your appointment. Should you need to reschedule your appointment, please contact radiology at [phone]. This test typically takes about 30 minutes to perform.   You have been scheduled for an endoscopy. Please follow written instructions given to you at your visit today.  If you use inhalers (even only as needed), please bring them with you on the day of your procedure.  If you take any of the following medications, they will need to be adjusted prior to your procedure:   DO NOT TAKE 7 DAYS PRIOR TO TEST- Trulicity (dulaglutide) Ozempic, Wegovy (semaglutide) Mounjaro (tirzepatide) Bydureon Bcise (exanatide extended release)  DO NOT TAKE 1 DAY PRIOR TO YOUR TEST Rybelsus (semaglutide) Adlyxin (lixisenatide) Victoza (liraglutide) Byetta (exanatide) ___________________________________________________________________________  Follow up after your procedure.   Thank you for  trusting me with your gastrointestinal care!   [name], PA-C  _______________________________________________________  If your blood pressure at your visit was 140/90 or greater, please contact your primary care physician to follow up on this.  _______________________________________________________  If you are age 66 or older, your body mass index should be between 23-30. Your Body mass index is 32.47 kg/m. If this is out of the aforementioned range listed, please consider follow up with your Primary Care Provider.  If you are age 64 or younger, your body mass index should be between 19-25. Your Body mass index is 32.47 kg/m. If this is out of the aformentioned range listed, please consider follow up with your Primary Care Provider.   ________________________________________________________  The [facility] providers would like to encourage you to use MYCHART to communicate with providers for non-urgent requests or questions.  Due to long hold times on the telephone, sending your provider a message by [facility] may be a faster and more efficient way to get a response.  Please allow 48 business hours for a response.  Please remember that this is for non-urgent requests.  _______________________________________________________  [facility] is using a team-based approach to care.  Your team is made up of your doctor and two to three APPS. Our APPS (Nurse Practitioners and Physician Assistants) work with your physician to ensure care continuity for you. They are fully qualified to address your health concerns and develop a treatment plan. They communicate directly with your gastroenterologist to care for you. Seeing the Advanced Practice Practitioners on your physician's team can help you by facilitating care more promptly, often allowing for  earlier appointments, access to diagnostic testing, procedures, and other specialty referrals.

## 2023-11-11 ENCOUNTER — Ambulatory Visit: Payer: Self-pay | Admitting: Physician Assistant

## 2023-11-11 LAB — CELIAC DISEASE AB SCREEN W/RFX
Antigliadin Abs, IgA: 7 U (ref 0–19)
IgA/Immunoglobulin A, Serum: 114 mg/dL (ref 87–352)
Transglutaminase IgA: <2 U/mL (ref 0–3)

## 2023-11-12 ENCOUNTER — Ambulatory Visit (AMBULATORY_SURGERY_CENTER): Admitting: Gastroenterology

## 2023-11-12 ENCOUNTER — Ambulatory Visit (HOSPITAL_COMMUNITY)
Admission: RE | Admit: 2023-11-12 | Discharge: 2023-11-12 | Disposition: A | Discharge status: Home / Self Care | Source: Ambulatory Visit | Attending: Physician Assistant | Admitting: Physician Assistant

## 2023-11-12 ENCOUNTER — Encounter: Payer: Self-pay | Admitting: Gastroenterology

## 2023-11-12 VITALS — BP 143/78 | HR 71 | Temp 97.0°F | Resp 15 | Ht 65.0 in | Wt 195.0 lb

## 2023-11-12 DIAGNOSIS — D3A8 Other benign neuroendocrine tumors: Secondary | ICD-10-CM | POA: Diagnosis not present

## 2023-11-12 DIAGNOSIS — K2950 Unspecified chronic gastritis without bleeding: Secondary | ICD-10-CM

## 2023-11-12 DIAGNOSIS — K219 Gastro-esophageal reflux disease without esophagitis: Secondary | ICD-10-CM | POA: Diagnosis not present

## 2023-11-12 DIAGNOSIS — K317 Polyp of stomach and duodenum: Secondary | ICD-10-CM

## 2023-11-12 DIAGNOSIS — R1013 Epigastric pain: Secondary | ICD-10-CM | POA: Diagnosis present

## 2023-11-12 DIAGNOSIS — R1011 Right upper quadrant pain: Secondary | ICD-10-CM | POA: Insufficient documentation

## 2023-11-12 DIAGNOSIS — K2970 Gastritis, unspecified, without bleeding: Secondary | ICD-10-CM

## 2023-11-12 LAB — ALPHA-GAL PANEL
Allergen, Mutton, f88: <0.1 kU/L
Allergen, Pork, f26: <0.1 kU/L
Beef: <0.1 kU/L
CLASS: 0
CLASS: 0
Class: 0
GALACTOSE-ALPHA-1,3-GALACTOSE IGE*: 0.18 kU/L — ABNORMAL HIGH (ref <0.10)

## 2023-11-12 LAB — INTERPRETATION:

## 2023-11-12 MED ORDER — SODIUM CHLORIDE 0.9 % IV SOLN: 500.0000 mL | INTRAVENOUS | Status: DC

## 2023-11-12 NOTE — Op Note (Signed)
 [facility] Patient Name: Audrey Hurley Procedure Date: 11/12/2023 2:14 PM MRN: [id number] Endoscopist: Gustav ALONSO Mcgee , MD, [id number] Age: 58 Referring MD:  Date of Birth: 07/26/1965 Gender: Female Account #: [id number] Procedure:                Upper GI endoscopy Indications:              Epigastric abdominal pain, Dyspepsia, Nausea Medicines:                Monitored Anesthesia Care Procedure:                Pre-Anesthesia Assessment:                           - Prior to the procedure, a History and Physical                            was performed, and patient medications and                            allergies were reviewed. The patient's tolerance of                            previous anesthesia was also reviewed. The risks                            and benefits of the procedure and the sedation                            options and risks were discussed with the patient.                            All questions were answered, and informed consent                            was obtained. Prior Anticoagulants: The patient has                            taken no anticoagulant or antiplatelet agents. ASA                            Grade Assessment: III - A patient with severe                            systemic disease. After reviewing the risks and                            benefits, the patient was deemed in satisfactory                            condition to undergo the procedure.                           After obtaining informed consent, the endoscope was  passed under direct vision. Throughout the                            procedure, the patient's blood pressure, pulse, and                            oxygen saturations were monitored continuously. The                            GIF [id number] #[id number] was introduced through the                            mouth, and advanced to the second part of duodenum.                             The upper GI endoscopy was accomplished without                            difficulty. The patient tolerated the procedure                            well. Scope In: Scope Out: Findings:                 The Z-line was regular and was found 38 cm from the                            incisors.                           No gross lesions were noted in the entire esophagus.                           Patchy mild inflammation characterized by                            congestion (edema) and friability was found in the                            entire examined stomach. Biopsies were taken with a                            cold forceps for Helicobacter pylori testing.                           A single 12 mm sessile polyp with bleeding and no                            stigmata of recent bleeding was found in the                            gastric body. The polyp was removed with a hot  snare. Resection and retrieval were complete.                           The cardia and gastric fundus were normal on                            retroflexion.                           The examined duodenum was normal. Biopsies were                            taken with a cold forceps for histology. Complications:            No immediate complications. Estimated Blood Loss:     Estimated blood loss was minimal. Impression:               - Z-line regular, 38 cm from the incisors.                           - No gross lesions in the entire esophagus.                           - Gastritis.                           - A single gastric polyp. Resected and retrieved.                           - Normal examined duodenum. Recommendation:           - Resume previous diet.                           - Continue present medications.                           - Await pathology results.                           - Follow an antireflux regimen.                           - Follow up office visit in 4-6  weeks with APP                           - F/u RUQ ultrasound and Alpha Gal results Gustav ALONSO Mcgee, MD 11/12/2023 3:03:16 PM This report has been signed electronically.

## 2023-11-12 NOTE — Progress Notes (Unsigned)
 Sedate, gd SR, tolerated procedure well, VSS, report to RN

## 2023-11-12 NOTE — Progress Notes (Unsigned)
 Pt's states no medical or surgical changes since previsit or office visit.

## 2023-11-12 NOTE — Patient Instructions (Addendum)
 Thank you for letting us  take care of your healthcare needs today. Please see handouts given to you on Gastritis and Antireflux regimen. Appt with [name] PA in 6 weeks, Resume previous diet.    YOU HAD AN ENDOSCOPIC PROCEDURE TODAY AT THE [facility]:   Refer to the procedure report that was given to you for any specific questions about what was found during the examination.  If the procedure report does not answer your questions, please call your gastroenterologist to clarify.  If you requested that your care partner not be given the details of your procedure findings, then the procedure report has been included in a sealed envelope for you to review at your convenience later.  YOU SHOULD EXPECT: Some feelings of bloating in the abdomen. Passage of more gas than usual.  Walking can help get rid of the air that was put into your GI tract during the procedure and reduce the bloating. If you had a lower endoscopy (such as a colonoscopy or flexible sigmoidoscopy) you may notice spotting of blood in your stool or on the toilet paper. If you underwent a bowel prep for your procedure, you may not have a normal bowel movement for a few days.  Please Note:  You might notice some irritation and congestion in your nose or some drainage.  This is from the oxygen used during your procedure.  There is no need for concern and it should clear up in a day or so.  SYMPTOMS TO REPORT IMMEDIATELY:  Following upper endoscopy (EGD)  Vomiting of blood or coffee ground material  New chest pain or pain under the shoulder blades  Painful or persistently difficult swallowing  New shortness of breath  Fever of 100F or higher  Black, tarry-looking stools  For urgent or emergent issues, a gastroenterologist can be reached at any hour by calling [phone]. Do not use MyChart messaging for urgent concerns.    DIET:  We do recommend a small meal at first, but then you may proceed to your regular  diet.  Drink plenty of fluids but you should avoid alcoholic beverages for 24 hours.  ACTIVITY:  You should plan to take it easy for the rest of today and you should NOT DRIVE or use heavy machinery until tomorrow (because of the sedation medicines used during the test).    FOLLOW UP: Our staff will call the number listed on your records the next business day following your procedure.  We will call around 7:15- 8:00 am to check on you and address any questions or concerns that you may have regarding the information given to you following your procedure. If we do not reach you, we will leave a message.     If any biopsies were taken you will be contacted by phone or by letter within the next 1-3 weeks.  Please call us  at [phone] if you have not heard about the biopsies in 3 weeks.    SIGNATURES/CONFIDENTIALITY: You and/or your care partner have signed paperwork which will be entered into your electronic medical record.  These signatures attest to the fact that that the information above on your After Visit Summary has been reviewed and is understood.  Full responsibility of the confidentiality of this discharge information lies with you and/or your care-partner.

## 2023-11-12 NOTE — Progress Notes (Unsigned)
 [name] History and Physical   Primary Care Physician:  Alla Amis, MD   Reason for Procedure:  Epigastric abd pain, h/o gastric ulcer, nausea, GERD  Plan:    EGD and colonoscopy with possible interventions as needed     HPI: Audrey Hurley is a very pleasant 58 y.o. female here for EGD for epigastric abd pain, h/o gastric ulcer, nausea, GERD.   The risks and benefits as well as alternatives of endoscopic procedure(s) have been discussed and reviewed. All questions answered. The patient agrees to proceed.    Past Medical History:  Diagnosis Date   Allergy    seasonal-takes zyrtec   Depression    Diabetes mellitus    taking metformin   Fatty liver 05/03/2009   seen on CT   GERD (gastroesophageal reflux disease)    Hyperlipidemia    Hypertension    Kidney stones    Melanoma (HCC)    Sleep apnea    On CPAP   Ureteral calculus 05/03/2009    Past Surgical History:  Procedure Laterality Date   BREAST BIOPSY Right 06/17/2007   CESAREAN SECTION  01/16/1990   DILATION AND CURETTAGE OF UTERUS     ENDOMETRIAL ABLATION W/ NOVASURE     LAPAROSCOPIC HYSTERECTOMY  01/16/2005   with lysis of adhesions   Sling cystourethropexy     TUBAL LIGATION      Prior to Admission medications   Medication Sig Start Date End Date Taking? Authorizing Provider  amLODipine (NORVASC) 5 MG tablet Take 5 mg by mouth. 04/23/23 04/22/24 Yes [provider]  Ascorbic Acid (VITAMIN C) 1000 MG tablet 1 tablet Orally Once a day   Yes [provider]  BELSOMRA 10 MG TABS Take 1 tablet by mouth at bedtime as needed. 08/10/19  Yes [provider]  Calcium Carbonate-Vitamin D  (CALCIUM 600 + D PO) Take 1 tablet by mouth 2 (two) times daily. Patient taking differently: Take 1 tablet by mouth daily.   Yes [provider]  Cholecalciferol (VITAMIN D ) 2000 units CAPS Take 1 capsule by mouth at bedtime.   Yes [provider]  citalopram  (CELEXA) 40 MG tablet Take 40 mg by mouth daily. 04/29/19  Yes [provider]  clobetasol ointment (TEMOVATE) 0.05 % Apply topically 2 (two) times daily. 08/03/23  Yes [provider]  Coenzyme Q10 (CO Q-10) 100 MG CAPS Take 1 capsule by mouth daily.   Yes [provider]  dexlansoprazole  (DEXILANT ) 60 MG capsule Take 1 capsule (60 mg total) by mouth daily. 11/09/22 11/12/23 Yes [place], PA-C  estradiol (VIVELLE-DOT) 0.05 MG/24HR patch 1 patch 2 (two) times a week.   Yes [provider]  fenofibrate (TRICOR) 145 MG tablet Take 145 mg by mouth daily. 05/23/21  Yes [provider]  fluticasone OREN) 50 MCG/ACT nasal spray  06/14/11  Yes [provider]  hydrochlorothiazide 25 MG tablet Take 25 mg by mouth daily.   Yes [provider]  hydrocortisone  2.5 % ointment Apply topically 2 (two) times daily. to affected area(s) 09/15/22  Yes [provider]  metFORMIN (GLUCOPHAGE) 500 MG tablet TAKE 2 TABLETS BY MOUTH TWICE DAILY WITH MEALS (BREAKFAST & SUPPER). 06/03/21  Yes [provider]  montelukast (SINGULAIR) 10 MG tablet Take 10 mg by mouth daily. 07/21/22  Yes [provider]  MUCINEX 600 MG 12 hr tablet 1 tablet as needed Orally every 12 hrs; Duration: 4 days 07/03/23  Yes [provider]  Multiple Vitamin (MULTIVITAMIN)  tablet Take 1 tablet by mouth daily.   Yes [provider]  nitrofurantoin  (MACRODANTIN ) 100 MG capsule Take 1 capsule (100 mg total) by mouth daily. 07/30/23  Yes MacDiarmid, Glendia, MD  omega-3 fish oil (MAXEPA) 1000 MG CAPS capsule Take by mouth.   Yes [provider]  pramipexole (MIRAPEX) 0.125 MG tablet Take by mouth. 05/16/21  Yes [provider]  pyridOXINE (VITAMIN B-6) 100 MG tablet Take 200 mg by mouth daily.   Yes [provider]  rosuvastatin (CRESTOR) 10 MG tablet Take 1 tablet by mouth daily. 01/27/16  Yes [provider]   sucralfate  (CARAFATE ) 1 g tablet Take 1 tablet (1 g total) by mouth 3 (three) times daily before meals. 09/26/22 10/16/25 Yes [place], PA-C  TRELEGY ELLIPTA 200-62.5-25 MCG/ACT AEPB SMARTSIG:1 inhalation By Mouth Daily   Yes [provider]  vitamin E 400 UNIT capsule Take 800 Units by mouth daily.   Yes [provider]  nitrofurantoin , macrocrystal-monohydrate, (MACROBID ) 100 MG capsule TAKE (1) CAPSULE BY MOUTH ONCE DAILY.    [provider]  OZEMPIC, 2 MG/DOSE, 8 MG/3ML SOPN Inject into the skin. 06/03/21   [provider]  triamcinolone cream (KENALOG) 0.1 % Apply topically 2 (two) times daily. to affected area(s) 09/15/22   [provider]    Current Outpatient Medications  Medication Sig Dispense Refill   amLODipine (NORVASC) 5 MG tablet Take 5 mg by mouth.     Ascorbic Acid (VITAMIN C) 1000 MG tablet 1 tablet Orally Once a day     BELSOMRA 10 MG TABS Take 1 tablet by mouth at bedtime as needed.     Calcium Carbonate-Vitamin D  (CALCIUM 600 + D PO) Take 1 tablet by mouth 2 (two) times daily. (Patient taking differently: Take 1 tablet by mouth daily.)     Cholecalciferol (VITAMIN D ) 2000 units CAPS Take 1 capsule by mouth at bedtime.     citalopram (CELEXA) 40 MG tablet Take 40 mg by mouth daily.     clobetasol ointment (TEMOVATE) 0.05 % Apply topically 2 (two) times daily.     Coenzyme Q10 (CO Q-10) 100 MG CAPS Take 1 capsule by mouth daily.     dexlansoprazole  (DEXILANT ) 60 MG capsule Take 1 capsule (60 mg total) by mouth daily. 90 capsule 3   estradiol (VIVELLE-DOT) 0.05 MG/24HR patch 1 patch 2 (two) times a week.     fenofibrate (TRICOR) 145 MG tablet Take 145 mg by mouth daily.     fluticasone (FLONASE) 50 MCG/ACT nasal spray      hydrochlorothiazide 25 MG tablet Take 25 mg by mouth daily.     hydrocortisone  2.5 % ointment Apply topically 2 (two) times daily. to affected area(s)     metFORMIN (GLUCOPHAGE) 500 MG tablet TAKE 2 TABLETS  BY MOUTH TWICE DAILY WITH MEALS (BREAKFAST & SUPPER).     montelukast (SINGULAIR) 10 MG tablet Take 10 mg by mouth daily.     MUCINEX 600 MG 12 hr tablet 1 tablet as needed Orally every 12 hrs; Duration: 4 days     Multiple Vitamin (MULTIVITAMIN) tablet Take 1 tablet by mouth daily.     nitrofurantoin  (MACRODANTIN ) 100 MG capsule Take 1 capsule (100 mg total) by mouth daily. 90 capsule 3   omega-3 fish oil (MAXEPA) 1000 MG CAPS capsule Take by mouth.     pramipexole (MIRAPEX) 0.125 MG tablet Take by mouth.     pyridOXINE (VITAMIN B-6) 100 MG tablet Take 200 mg by mouth  daily.     rosuvastatin (CRESTOR) 10 MG tablet Take 1 tablet by mouth daily.     sucralfate  (CARAFATE ) 1 g tablet Take 1 tablet (1 g total) by mouth 3 (three) times daily before meals. 90 tablet 5   TRELEGY ELLIPTA 200-62.5-25 MCG/ACT AEPB SMARTSIG:1 inhalation By Mouth Daily     vitamin E 400 UNIT capsule Take 800 Units by mouth daily.     nitrofurantoin , macrocrystal-monohydrate, (MACROBID ) 100 MG capsule TAKE (1) CAPSULE BY MOUTH ONCE DAILY.     OZEMPIC, 2 MG/DOSE, 8 MG/3ML SOPN Inject into the skin.     triamcinolone cream (KENALOG) 0.1 % Apply topically 2 (two) times daily. to affected area(s)     Current Facility-Administered Medications  Medication Dose Route Frequency Provider Last Rate Last Admin   0.9 %  sodium chloride  infusion  500 mL Intravenous Continuous Opha Mcghee V, MD        Allergies as of 11/12/2023 - Review Complete 11/12/2023  Allergen Reaction Noted   Ciprofloxacin Other (See Comments), Rash, Shortness Of Breath, and Nausea And Vomiting 07/19/2015   Sulfa antibiotics Hives, Itching, Other (See Comments), and Rash 07/28/2010   Aspirin Other (See Comments) 07/28/2010   Ciprocin-fluocin-procin [fluocinolone acetonide] Rash 07/28/2010   Enalapril Cough 05/23/2022    Family History  Problem Relation Age of Onset   Breast cancer Mother 75       recurred - metastasized to brain & sternum    Hypertension Mother    Colon polyps Father    Breast cancer Maternal Aunt 40   Cirrhosis Maternal Aunt    Heart disease Maternal Grandfather    Prostate cancer Maternal Grandfather    Heart disease Paternal Grandmother    Hypertension Son    Colon cancer Neg Hx    Rectal cancer Neg Hx    Esophageal cancer Neg Hx    Stomach cancer Neg Hx     Social History   Socioeconomic History   Marital status: Married    Spouse name: Animal Nutritionist    Number of children: 2   Years of education: 12   Highest education level: Not on file  Occupational History   Occupation: Neurosurgeon    Comment: International Aid/development Worker  Tobacco Use   Smoking status: Never    Passive exposure: Never   Smokeless tobacco: Never  Vaping Use   Vaping status: Never Used  Substance and Sexual Activity   Alcohol use: Yes    Comment: rare   Drug use: No   Sexual activity: Not on file  Other Topics Concern   Not on file  Social History Narrative   Patient is married Producer, Television/film/video)   Patient has 2 children.    Patient is currently working full-time.    Patient is right-handed.    Patient has 12th grade education.    Social Drivers of Corporate Investment Banker Strain: Low Risk  (11/02/2023)   Received from [facility]   Overall Financial Resource Strain (CARDIA)    Difficulty of Paying Living Expenses: Not hard at all  Food Insecurity: No Food Insecurity (11/02/2023)   Received from [facility]   Hunger Vital Sign    Within the past 12 months, you worried that your food would run out before you got the money to buy more.: Never true    Within the past 12 months, the food you bought just didn't last and you didn't have money to get more.: Never true  Transportation Needs: No Transportation Needs (11/02/2023)   Received from [facility] - Transportation    In the past 12 months, has lack of transportation kept you from medical appointments or from  getting medications?: No    Lack of Transportation (Non-Medical): No  Physical Activity: Not on file  Stress: Not on file  Social Connections: Not on file  Intimate Partner Violence: Not on file    Review of Systems:  All other review of systems negative except as mentioned in the HPI.  Physical Exam: Vital signs in last 24 hours: BP (!) 145/80   Pulse 74   Temp (!) 97 F (36.1 C) (Temporal)   Ht 5' 5 (1.651 m)   Wt 195 lb (88.5 kg)   SpO2 95%   BMI 32.45 kg/m  General:   Alert, NAD Lungs:  Clear .   Heart:  Regular rate and rhythm Abdomen:  Soft, nontender and nondistended. Neuro/Psych:  Alert and cooperative. Normal mood and affect. A and O x 3  Reviewed labs, radiology imaging, old records and pertinent past GI work up  Patient is appropriate for planned procedure(s) and anesthesia in an ambulatory setting   K. Veena Sulayman Manning , MD [phone]

## 2023-11-12 NOTE — Progress Notes (Signed)
 Called to room to assist during endoscopic procedure.  Patient ID and intended procedure confirmed with present staff. Received instructions for my participation in the procedure from the performing physician.

## 2023-11-13 ENCOUNTER — Telehealth: Payer: Self-pay

## 2023-11-13 NOTE — Telephone Encounter (Signed)
  Follow up Call-     11/12/2023    1:18 PM  Call back number  Post procedure Call Back phone  # [phone]  Permission to leave phone message Yes     Patient questions:  Do you have a fever, pain , or abdominal swelling? No. Pain Score  0 *  Have you tolerated food without any problems? Yes.    Have you been able to return to your normal activities? Yes.    Do you have any questions about your discharge instructions: Diet   No. Medications  No. Follow up visit  No.  Do you have questions or concerns about your Care? No.  Actions: * If pain score is 4 or above: No action needed, pain <4.

## 2023-11-16 LAB — SURGICAL PATHOLOGY

## 2023-11-19 ENCOUNTER — Ambulatory Visit: Payer: Self-pay | Admitting: Gastroenterology

## 2023-11-19 DIAGNOSIS — C7A8 Other malignant neuroendocrine tumors: Secondary | ICD-10-CM

## 2023-11-19 DIAGNOSIS — K317 Polyp of stomach and duodenum: Secondary | ICD-10-CM

## 2023-11-28 ENCOUNTER — Other Ambulatory Visit: Payer: Self-pay | Admitting: Physician Assistant

## 2023-11-28 DIAGNOSIS — K219 Gastro-esophageal reflux disease without esophagitis: Secondary | ICD-10-CM

## 2023-11-29 ENCOUNTER — Encounter: Payer: Self-pay | Admitting: Gastroenterology

## 2023-12-04 ENCOUNTER — Other Ambulatory Visit: Payer: Self-pay | Admitting: Physician Assistant

## 2023-12-04 ENCOUNTER — Encounter: Payer: Self-pay | Admitting: Gastroenterology

## 2023-12-04 ENCOUNTER — Ambulatory Visit: Admitting: Gastroenterology

## 2023-12-04 VITALS — BP 142/92 | HR 73 | Temp 97.5°F | Resp 18 | Ht 65.0 in | Wt 195.0 lb

## 2023-12-04 DIAGNOSIS — C7A1 Malignant poorly differentiated neuroendocrine tumors: Secondary | ICD-10-CM

## 2023-12-04 DIAGNOSIS — K219 Gastro-esophageal reflux disease without esophagitis: Secondary | ICD-10-CM

## 2023-12-04 DIAGNOSIS — K317 Polyp of stomach and duodenum: Secondary | ICD-10-CM

## 2023-12-04 MED ORDER — SODIUM CHLORIDE 0.9 % IV SOLN: 500.0000 mL | Freq: Once | INTRAVENOUS | Status: AC

## 2023-12-04 NOTE — Op Note (Signed)
 [facility] Patient Name: Audrey Hurley Procedure Date: 12/04/2023 2:09 PM MRN: [id number] Endoscopist: Gustav ALONSO Mcgee , MD, [id number] Age: 58 Referring MD:  Date of Birth: [birth year] Gender: Female Account #: [id number] Procedure:                Upper GI endoscopy Indications:              Follow-up of gastric neuroendocrine tumor, For                            therapy of residula gastric neuroendocrine tumor Medicines:                Monitored Anesthesia Care Procedure:                Pre-Anesthesia Assessment:                           - Prior to the procedure, a History and Physical                            was performed, and patient medications and                            allergies were reviewed. The patient's tolerance of                            previous anesthesia was also reviewed. The risks                            and benefits of the procedure and the sedation                            options and risks were discussed with the patient.                            All questions were answered, and informed consent                            was obtained. Prior Anticoagulants: The patient has                            taken no anticoagulant or antiplatelet agents. ASA                            Grade Assessment: II - A patient with mild systemic                            disease. After reviewing the risks and benefits,                            the patient was deemed in satisfactory condition to                            undergo the procedure.  After obtaining informed consent, the endoscope was                            passed under direct vision. Throughout the                            procedure, the patient's blood pressure, pulse, and                            oxygen saturations were monitored continuously. The                            GIF [id number] #[id number] was introduced through the                             mouth, and advanced to the second part of duodenum.                            The upper GI endoscopy was accomplished without                            difficulty. The patient tolerated the procedure                            well. Scope In: Scope Out: Findings:                 The Z-line was regular and was found 40 cm from the                            incisors.                           A single 12 mm sessile polyp with no bleeding and                            stigmata of recent bleeding was found in the                            cardia. The polyp was removed with a hot snare.                            Resection and retrieval were complete.                           Two 5 to 10 mm sessile polyps with no bleeding and                            no stigmata of recent bleeding were found on the                            lesser curvature of the stomach. The polyp was  removed with a hot snare. Resection and retrieval                            were complete.                           Multiple 10 to 20 mm pedunculated and sessile                            polyps with no bleeding and no stigmata of recent                            bleeding were found in the gastric body. Biopsies                            were taken with a cold forceps for histology.                           The examined duodenum was normal. Complications:            No immediate complications. Estimated Blood Loss:     Estimated blood loss was minimal. Impression:               - Z-line regular, 40 cm from the incisors.                           - A single gastric polyp. Resected and retrieved.                           - Two gastric polyps. Resected and retrieved.                           - Multiple gastric polyps. Biopsied.                           - Normal examined duodenum. Recommendation:           - Resume previous diet.                           - Continue present  medications.                           - Await pathology results.                           - No aspirin, ibuprofen, naproxen, or other                            non-steroidal anti-inflammatory drugs for 2 weeks.                           - Repeat upper endoscopy after studies are complete                            for surveillance based on pathology results, may  consider scheduling at hospital endoscopy unit if                            needed. Audrey Kilduff V. Lupie Sawa, MD 12/04/2023 2:57:17 PM This report has been signed electronically.

## 2023-12-04 NOTE — Progress Notes (Unsigned)
 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   The risks and benefits as well as alternatives of endoscopic procedure(s) have been discussed and reviewed.  The patient was provided an opportunity to ask questions and all were answered. The patient agreed with the plan and demonstrated an understanding of the instructions.   Past Medical History:  Diagnosis Date   Allergy    seasonal-takes zyrtec   Depression    Diabetes mellitus    taking metformin   Fatty liver 05/03/2009   seen on CT   GERD (gastroesophageal reflux disease)    Hyperlipidemia    Hypertension    Kidney stones    Melanoma (HCC)    Sleep apnea    On CPAP   Ureteral calculus 05/03/2009    Past Surgical History:  Procedure Laterality Date   BREAST BIOPSY Right 06/17/2007   CESAREAN SECTION  01/16/1990   DILATION AND CURETTAGE OF UTERUS     ENDOMETRIAL ABLATION W/ NOVASURE     LAPAROSCOPIC HYSTERECTOMY  01/16/2005   with lysis of adhesions   Sling cystourethropexy     TUBAL LIGATION      Prior to Admission medications   Medication Sig Start Date End Date Taking? Authorizing Provider  amLODipine (NORVASC) 5 MG tablet Take 5 mg by mouth. 04/23/23 04/22/24 Yes [provider]  Ascorbic Acid (VITAMIN C) 1000 MG tablet 1 tablet Orally Once a day   Yes [provider]  BELSOMRA 10 MG TABS Take 1 tablet by mouth at bedtime as needed. 08/10/19  Yes [provider]  Calcium Carbonate-Vitamin D  (CALCIUM 600 + D PO) Take 1 tablet by mouth 2 (two) times daily. Patient taking differently: Take 1 tablet by mouth daily.   Yes [provider]   Cholecalciferol (VITAMIN D ) 2000 units CAPS Take 1 capsule by mouth at bedtime.   Yes [provider]  citalopram (CELEXA) 40 MG tablet Take 40 mg by mouth daily. 04/29/19  Yes [provider]  Coenzyme Q10 (CO Q-10) 100 MG CAPS Take 1 capsule by mouth daily.   Yes [provider]  dexlansoprazole  (DEXILANT ) 60 MG capsule TAKE 1 CAPSULE DAILY 11/28/23  Yes [place], PA-C  estradiol (VIVELLE-DOT) 0.05 MG/24HR patch 1 patch 2 (two) times a week.   Yes [provider]  fenofibrate (TRICOR) 145 MG tablet Take 145 mg by mouth daily. 05/23/21  Yes [provider]  fluticasone OREN) 50 MCG/ACT nasal spray  06/14/11  Yes [provider]  hydrochlorothiazide 25 MG tablet Take 25 mg by mouth daily.   Yes [provider]  metFORMIN (GLUCOPHAGE) 500 MG tablet TAKE 2 TABLETS BY MOUTH TWICE DAILY WITH MEALS (BREAKFAST & SUPPER). 06/03/21  Yes [provider]  montelukast (SINGULAIR) 10 MG tablet Take 10 mg by mouth daily. 07/21/22  Yes [provider]  Multiple Vitamin (MULTIVITAMIN) tablet Take 1 tablet by mouth daily.   Yes [provider]  nitrofurantoin  (MACRODANTIN ) 100 MG capsule Take 1 capsule (100 mg total) by mouth daily. 07/30/23  Yes MacDiarmid, Glendia, MD  omega-3 fish oil (MAXEPA) 1000 MG CAPS capsule Take by mouth.   Yes [provider]  pramipexole (MIRAPEX) 0.125 MG tablet Take by mouth. 05/16/21  Yes [provider]  pyridOXINE (VITAMIN B-6) 100 MG tablet Take 200 mg by mouth daily.   Yes [provider]  rosuvastatin (CRESTOR) 10 MG tablet Take 1 tablet by mouth daily. 01/27/16  Yes [provider]  DOMINIC BECK 200-62.5-25 MCG/ACT AEPB SMARTSIG:1 inhalation By Mouth Daily   Yes [provider]  vitamin E 400 UNIT capsule Take 800 Units by mouth daily.   Yes [provider]  clobetasol ointment (TEMOVATE) 0.05 % Apply topically 2 (two) times daily.  08/03/23   [provider]  hydrocortisone  2.5 % ointment Apply topically 2 (two) times daily. to affected area(s) 09/15/22   [provider]  OZEMPIC, 2 MG/DOSE, 8 MG/3ML SOPN Inject into the skin. 06/03/21   [provider]  sucralfate  (CARAFATE ) 1 g tablet Take 1 tablet (1 g total) by mouth 3 (three) times daily before meals. Patient not taking: Reported on 12/04/2023 09/26/22 10/16/25  [place], PA-C  triamcinolone cream (KENALOG) 0.1 % Apply topically 2 (two) times daily. to affected area(s) 09/15/22   [provider]    Current Outpatient Medications  Medication Sig Dispense Refill   amLODipine (NORVASC) 5 MG tablet Take 5 mg by mouth.     Ascorbic Acid (VITAMIN C) 1000 MG tablet 1 tablet Orally Once a day     BELSOMRA 10 MG TABS Take 1 tablet by mouth at bedtime as needed.     Calcium Carbonate-Vitamin D  (CALCIUM 600 + D PO) Take 1 tablet by mouth 2 (two) times daily. (Patient taking differently: Take 1 tablet by mouth daily.)     Cholecalciferol (VITAMIN D ) 2000 units CAPS Take 1 capsule by mouth at bedtime.     citalopram (CELEXA) 40 MG tablet Take 40 mg by mouth daily.     Coenzyme Q10 (CO Q-10) 100 MG CAPS Take 1 capsule by mouth daily.     dexlansoprazole  (DEXILANT ) 60 MG capsule TAKE 1 CAPSULE DAILY 90 capsule 0   estradiol (VIVELLE-DOT) 0.05 MG/24HR patch 1 patch 2 (two) times a week.     fenofibrate (TRICOR) 145 MG tablet Take 145 mg by mouth daily.     fluticasone (FLONASE) 50 MCG/ACT nasal spray      hydrochlorothiazide 25 MG tablet Take 25 mg by mouth daily.     metFORMIN (GLUCOPHAGE) 500 MG tablet TAKE 2 TABLETS BY MOUTH TWICE DAILY WITH MEALS (BREAKFAST & SUPPER).     montelukast (SINGULAIR) 10 MG tablet Take 10 mg by mouth daily.     Multiple Vitamin (MULTIVITAMIN) tablet Take 1 tablet by mouth daily.     nitrofurantoin  (MACRODANTIN ) 100 MG capsule Take 1 capsule (100 mg total) by mouth daily. 90 capsule 3   omega-3 fish oil (MAXEPA)  1000 MG CAPS capsule Take by mouth.     pramipexole (MIRAPEX) 0.125 MG tablet Take by mouth.     pyridOXINE (VITAMIN B-6) 100 MG tablet Take 200 mg by mouth daily.     rosuvastatin (CRESTOR) 10 MG tablet Take 1 tablet by mouth daily.     TRELEGY ELLIPTA 200-62.5-25 MCG/ACT AEPB SMARTSIG:1 inhalation By Mouth Daily     vitamin E 400 UNIT capsule Take 800 Units by mouth daily.     clobetasol ointment (TEMOVATE) 0.05 % Apply topically 2 (two) times daily.     hydrocortisone  2.5 % ointment Apply topically 2 (two) times daily. to affected area(s)     OZEMPIC, 2 MG/DOSE, 8  MG/3ML SOPN Inject into the skin.     sucralfate  (CARAFATE ) 1 g tablet Take 1 tablet (1 g total) by mouth 3 (three) times daily before meals. (Patient not taking: Reported on 12/04/2023) 90 tablet 5   triamcinolone cream (KENALOG) 0.1 % Apply topically 2 (two) times daily. to affected area(s)     Current Facility-Administered Medications  Medication Dose Route Frequency Provider Last Rate Last Admin   0.9 %  sodium chloride  infusion  500 mL Intravenous Once Jerimie Mancuso V, MD        Allergies as of 12/04/2023 - Review Complete 12/04/2023  Allergen Reaction Noted   Ciprofloxacin Other (See Comments), Rash, Shortness Of Breath, and Nausea And Vomiting 07/19/2015   Sulfa antibiotics Hives, Itching, Other (See Comments), and Rash 07/28/2010   Aspirin Other (See Comments) 07/28/2010   Ciprocin-fluocin-procin [fluocinolone acetonide] Rash 07/28/2010   Enalapril Cough 05/23/2022    Family History  Problem Relation Age of Onset   Breast cancer Mother 48       recurred - metastasized to brain & sternum   Hypertension Mother    Colon polyps Father    Breast cancer Maternal Aunt 40   Cirrhosis Maternal Aunt    Heart disease Maternal Grandfather    Prostate cancer Maternal Grandfather    Heart disease Paternal Grandmother    Hypertension Son    Colon cancer Neg Hx    Rectal cancer Neg Hx    Esophageal cancer Neg Hx     Stomach cancer Neg Hx     Social History   Socioeconomic History   Marital status: Married    Spouse name: Animal Nutritionist    Number of children: 2   Years of education: 12   Highest education level: Not on file  Occupational History   Occupation: Neurosurgeon    Comment: International Aid/development Worker  Tobacco Use   Smoking status: Never    Passive exposure: Never   Smokeless tobacco: Never  Vaping Use   Vaping status: Never Used  Substance and Sexual Activity   Alcohol use: Yes    Comment: rare   Drug use: No   Sexual activity: Not on file  Other Topics Concern   Not on file  Social History Narrative   Patient is married Producer, Television/film/video)   Patient has 2 children.    Patient is currently working full-time.    Patient is right-handed.    Patient has 12th grade education.    Social Drivers of Corporate Investment Banker Strain: Low Risk  (11/02/2023)   Received from [facility]   Overall Financial Resource Strain (CARDIA)    Difficulty of Paying Living Expenses: Not hard at all  Food Insecurity: No Food Insecurity (11/02/2023)   Received from [facility]   Hunger Vital Sign    Within the past 12 months, you worried that your food would run out before you got the money to buy more.: Never true    Within the past 12 months, the food you bought just didn't last and you didn't have money to get more.: Never true  Transportation Needs: No Transportation Needs (11/02/2023)   Received from [facility] - Transportation    In the past 12 months, has lack of transportation kept you from medical appointments or from getting medications?: No    Lack of Transportation (Non-Medical): No  Physical Activity: Not on file  Stress: Not on file  Social Connections: Not  on file  Intimate Partner Violence: Not on file    Review of Systems:  All other review of systems negative except as mentioned in the HPI.  Physical Exam: Vital signs in  last 24 hours: BP (!) 141/69   Pulse 77   Temp (!) 97.5 F (36.4 C) (Temporal)   Ht 5' 5 (1.651 m)   Wt 195 lb (88.5 kg)   SpO2 97%   BMI 32.45 kg/m  General:   Alert, NAD Lungs:  Clear .   Heart:  Regular rate and rhythm Abdomen:  Soft, nontender and nondistended. Neuro/Psych:  Alert and cooperative. Normal mood and affect. A and O x 3  Reviewed labs, radiology imaging, old records and pertinent past GI work up  Patient is appropriate for planned procedure(s) and anesthesia in an ambulatory setting   K. Veena Ramir Malerba , MD [phone]

## 2023-12-04 NOTE — Patient Instructions (Signed)
 Discharge instructions given. Biopsies taken. No aspirin,ibuprofen,naproxen, or other non-steroidal anti-inflammatory drugs for 2 weeks. Office will call to schedule repeat EGD after studies. Resume previous medications. YOU HAD AN ENDOSCOPIC PROCEDURE TODAY AT THE [facility]:   Refer to the procedure report that was given to you for any specific questions about what was found during the examination.  If the procedure report does not answer your questions, please call your gastroenterologist to clarify.  If you requested that your care partner not be given the details of your procedure findings, then the procedure report has been included in a sealed envelope for you to review at your convenience later.  YOU SHOULD EXPECT: Some feelings of bloating in the abdomen. Passage of more gas than usual.  Walking can help get rid of the air that was put into your GI tract during the procedure and reduce the bloating. If you had a lower endoscopy (such as a colonoscopy or flexible sigmoidoscopy) you may notice spotting of blood in your stool or on the toilet paper. If you underwent a bowel prep for your procedure, you may not have a normal bowel movement for a few days.  Please Note:  You might notice some irritation and congestion in your nose or some drainage.  This is from the oxygen used during your procedure.  There is no need for concern and it should clear up in a day or so.  SYMPTOMS TO REPORT IMMEDIATELY:   Following upper endoscopy (EGD)  Vomiting of blood or coffee ground material  New chest pain or pain under the shoulder blades  Painful or persistently difficult swallowing  New shortness of breath  Fever of 100F or higher  Black, tarry-looking stools  For urgent or emergent issues, a gastroenterologist can be reached at any hour by calling [phone]. Do not use MyChart messaging for urgent concerns.    DIET:  We do recommend a small meal at first, but then you may  proceed to your regular diet.  Drink plenty of fluids but you should avoid alcoholic beverages for 24 hours.  ACTIVITY:  You should plan to take it easy for the rest of today and you should NOT DRIVE or use heavy machinery until tomorrow (because of the sedation medicines used during the test).    FOLLOW UP: Our staff will call the number listed on your records the next business day following your procedure.  We will call around 7:15- 8:00 am to check on you and address any questions or concerns that you may have regarding the information given to you following your procedure. If we do not reach you, we will leave a message.     If any biopsies were taken you will be contacted by phone or by letter within the next 1-3 weeks.  Please call us  at [phone] if you have not heard about the biopsies in 3 weeks.    SIGNATURES/CONFIDENTIALITY: You and/or your care partner have signed paperwork which will be entered into your electronic medical record.  These signatures attest to the fact that that the information above on your After Visit Summary has been reviewed and is understood.  Full responsibility of the confidentiality of this discharge information lies with you and/or your care-partner.  ______________________________________________________________________

## 2023-12-04 NOTE — Progress Notes (Unsigned)
 Pt's states no medical or surgical changes since previsit or office visit.

## 2023-12-04 NOTE — Progress Notes (Signed)
 Called to room to assist during endoscopic procedure.  Patient ID and intended procedure confirmed with present staff. Received instructions for my participation in the procedure from the performing physician.

## 2023-12-05 ENCOUNTER — Telehealth: Payer: Self-pay

## 2023-12-05 ENCOUNTER — Encounter: Payer: Self-pay | Admitting: Urology

## 2023-12-05 NOTE — Telephone Encounter (Signed)
  Follow up Call-     12/04/2023    1:06 PM 11/12/2023    1:18 PM  Call back number  Post procedure Call Back phone  # [phone]  Permission to leave phone message Yes Yes     Patient questions:  Do you have a fever, pain , or abdominal swelling? No. Pain Score  0 *  Have you tolerated food without any problems? Yes.    Have you been able to return to your normal activities? Yes.    Do you have any questions about your discharge instructions: Diet   No. Medications  No. Follow up visit  No.  Do you have questions or concerns about your Care? No.  Actions: * If pain score is 4 or above: No action needed, pain <4.

## 2023-12-10 ENCOUNTER — Encounter: Payer: Self-pay | Admitting: Gastroenterology

## 2023-12-12 LAB — SURGICAL PATHOLOGY

## 2023-12-20 ENCOUNTER — Other Ambulatory Visit (HOSPITAL_COMMUNITY): Payer: Self-pay

## 2023-12-20 ENCOUNTER — Telehealth: Payer: Self-pay | Admitting: *Deleted

## 2023-12-20 ENCOUNTER — Telehealth: Payer: Self-pay

## 2023-12-20 MED ORDER — DEXLANSOPRAZOLE 60 MG PO CPDR: 60.0000 mg | DELAYED_RELEASE_CAPSULE | Freq: Every day | ORAL | 1 refills | Status: AC

## 2023-12-20 NOTE — Telephone Encounter (Signed)
 PA request has been Submitted. New Encounter has been or will be created for follow up. For additional info see Pharmacy Prior Auth telephone encounter from 12-20-2023.

## 2023-12-20 NOTE — Telephone Encounter (Signed)
 Pharmacy Patient Advocate Encounter   Received notification from Pt Calls Messages that prior authorization for Dexlansoprazole  60MG  dr capsules is required/requested.   Insurance verification completed.   The patient is insured through [facility].   Per test claim: PA required; PA submitted to above mentioned insurance via Latent Key/confirmation #/EOC [id number] Status is pending

## 2023-12-20 NOTE — Telephone Encounter (Signed)
 Rx for Dexilant  resent to pharmacy.   PA team, could you please help with insurance prior authorization?

## 2023-12-20 NOTE — Telephone Encounter (Signed)
 Patient is calling because she states that she was supposed to get dexlansoprazole  at the pharmacy but was instead changed to pantoprazole . Nobody told her about this change. She says she has taken pantoprazole  previously and this was ineffective. Patient is requesting to be changed back to dexlansoprazole  and to ask for PA if insurance requires it since she has tried several PPI's that have been ineffective.

## 2023-12-21 NOTE — Telephone Encounter (Signed)
 Pharmacy Patient Advocate Encounter  Received notification from [facility] that Prior Authorization for Dexlansoprazole  60MG  dr capsules has been APPROVED from 12-20-2023 to 12-19-2024   PA #/Case ID/Reference #: [id number]

## 2023-12-24 ENCOUNTER — Ambulatory Visit: Admitting: Physician Assistant

## 2023-12-24 ENCOUNTER — Telehealth: Payer: Self-pay | Admitting: Physician Assistant

## 2023-12-24 ENCOUNTER — Other Ambulatory Visit

## 2023-12-24 ENCOUNTER — Encounter: Payer: Self-pay | Admitting: Physician Assistant

## 2023-12-24 VITALS — BP 136/76 | HR 83 | Ht 66.0 in | Wt 188.4 lb

## 2023-12-24 DIAGNOSIS — C169 Malignant neoplasm of stomach, unspecified: Secondary | ICD-10-CM

## 2023-12-24 DIAGNOSIS — K219 Gastro-esophageal reflux disease without esophagitis: Secondary | ICD-10-CM

## 2023-12-24 DIAGNOSIS — R112 Nausea with vomiting, unspecified: Secondary | ICD-10-CM

## 2023-12-24 DIAGNOSIS — R634 Abnormal weight loss: Secondary | ICD-10-CM

## 2023-12-24 DIAGNOSIS — R1013 Epigastric pain: Secondary | ICD-10-CM

## 2023-12-24 NOTE — Patient Instructions (Addendum)
 Your provider has requested that you go to the basement level for lab work before leaving today. Press B on the elevator. The lab is located at the first door on the left as you exit the elevator.  You have been scheduled for a CT scan of the abdomen and pelvis at [facility], 1st floor Radiology. You are scheduled on 12/31/23 at 1:30pm. You should arrive 15 minutes prior to your appointment time for registration.  We are giving you 2 bottles of contrast today that you will need to drink before arriving for the exam.  Please follow the written instructions below on the day of your exam:   1) Do not eat anything after 9:30 am (4 hours prior to your test)    The purpose of you drinking the oral contrast is to aid in the visualization of your intestinal tract. The contrast solution may cause some diarrhea. Depending on your individual set of symptoms, you may also receive an intravenous injection of x-ray contrast/dye. Plan on being at [facility] for 45 minutes or longer, depending on the type of exam you are having performed. If you have any questions regarding your exam or if you need to reschedule, you may call [facility] at [phone] between the hours of 8:00 am and 5:00 pm, Monday-Friday.   Please follow up sooner if symptoms increase or worsen  Due to recent changes in healthcare laws, you may see the results of your imaging and laboratory studies on MyChart before your provider has had a chance to review them.  We understand that in some cases there may be results that are confusing or concerning to you. Not all laboratory results come back in the same time frame and the provider may be waiting for multiple results in order to interpret others.  Please give us  48 hours in order for your provider to thoroughly review all the results before contacting the office for clarification of your results.   Thank you for trusting me with your gastrointestinal care!    [name], PA-C _______________________________________________________  If your blood pressure at your visit was 140/90 or greater, please contact your primary care physician to follow up on this.  _______________________________________________________  If you are age 19 or older, your body mass index should be between 23-30. Your Body mass index is 30.4 kg/m. If this is out of the aforementioned range listed, please consider follow up with your Primary Care Provider.  If you are age 11 or younger, your body mass index should be between 19-25. Your Body mass index is 30.4 kg/m. If this is out of the aformentioned range listed, please consider follow up with your Primary Care Provider.   ________________________________________________________  The [facility] providers would like to encourage you to use MYCHART to communicate with providers for non-urgent requests or questions.  Due to long hold times on the telephone, sending your provider a message by [facility] may be a faster and more efficient way to get a response.  Please allow 48 business hours for a response.  Please remember that this is for non-urgent requests.  _______________________________________________________

## 2023-12-24 NOTE — Progress Notes (Signed)
 [name], PA-C [address] [place], [place]  [address] Phone: [phone]   Primary Care Physician: Alla Amis, MD  Primary Gastroenterologist:  [name], PA-C / Dr. Shila  Chief Complaint:  F/U Gastric NET, Dyspepsia, Chronic N/V/D       HPI:   Discussed the use of AI scribe software for clinical note transcription with the patient, who gave verbal consent to proceed.  She is here today with her husband.  Here to discuss recent EGD results and follow-up with chronic upper GI symptoms.  12/04/2023 EGD by Dr. Shila (follow-up gastric NET):  - A single 12 mm sessile polyp without bleeding was removed from the cardia.  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  Pathology was consistent with a low-grade tumor. - 2 (5 mm to 10 mm) hyperplastic gastric polyps without bleeding were found in the lesser curvature of the stomach, removed, resected, with complete retrieval.   - Multiple 10 to 20 mm benign fundic gland gastric polyps pedunculated and sessile polyps without bleeding found in the gastric body.  No dysplasia..  Normal duodenum.  11/12/2023 EGD by Dr. Shila: Normal esophagus.  Patchy mild gastritis throughout the entire stomach.  A single 12 mm sessile polyp with bleeding removed from gastric body.  Normal duodenum.  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  Tumor was present at biopsy edge.  Duodenal biopsies were negative for celiac.  Gastric biopsy showed mild chronic gastritis.  Negative for H. pylori.  No intestinal metaplasia, dysplasia, or malignancy.  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  Gastrointestinal symptoms - Occasional 'sour stomach' symptoms after eating, including burping, nausea, vomiting, and diarrhea. -  Symptoms are triggered by specific foods such as spaghetti and chocolate. - No recent episodes of vomiting. - Dark, thick stools and irregular bowel movements, sometimes with up to three days between bowel movements. - Has been on Ozempic (per endocrinologist) for many years.  Dose was increased a year ago.  Medication response - Currently taking Dexilant , which has been more effective than previous treatments (Protonix , Prevacid, omeprazole ). - On Dexilant  for over a year, with most recent refill on December 4th (required prior Auth).  Abnormal imaging and hepatobiliary findings - Ultrasound in October 2025 showed fatty liver and layering sludge in the gallbladder, without evidence of gallstones. - Gallbladder has not been removed. - She denies abdominal pain.  Specifically denies RUQ pain.  Unintentional weight loss - Weight decreased from 205 pounds last year to 188 pounds currently. - Patient is Concerned regarding ongoing weight loss.  10/2019 EGD: Normal esophagus.  A few cratered gastric ulcers in the antrum (largest less than 1 mm).  Mild erosive gastritis.  Normal duodenum.  Biopsy negative for H. pylori.  10/2019 colonoscopy: 7 mm tubular adenoma polyp removed from ascending colon.  2 small (1 mm to 2 mm) hyperplastic and colon mucosal polyps removed from rectum and transverse colon.  Medium internal hemorrhoids.  Normal terminal ileum.  7-year repeat.  01/2021 CT abdomen pelvis without contrast: No liver abnormality.  Normal gallbladder, pancreas, spleen, stomach, and intestines.  2 small nonobstructing right kidney stones.  01/2020 CT abdomen pelvis with contrast: Hepatic steatosis.  No acute abnormality.  The caudate liver lobe is enlarged.  Portal vein dilated to 1.5 cm.  Normal gallbladder.  Normal pancreas  and intestines.  Current Outpatient Medications  Medication Sig Dispense Refill   amLODipine (NORVASC) 5 MG tablet Take 5 mg by mouth.     Ascorbic Acid (VITAMIN C) 1000  MG tablet 1 tablet Orally Once a day     BELSOMRA 10 MG TABS Take 1 tablet by mouth at bedtime as needed.     Calcium Carbonate-Vitamin D  (CALCIUM 600 + D PO) Take 1 tablet by mouth 2 (two) times daily. (Patient taking differently: Take 1 tablet by mouth daily.)     Cholecalciferol (VITAMIN D ) 2000 units CAPS Take 1 capsule by mouth at bedtime.     citalopram (CELEXA) 40 MG tablet Take 40 mg by mouth daily.     clobetasol ointment (TEMOVATE) 0.05 % Apply topically 2 (two) times daily.     Coenzyme Q10 (CO Q-10) 100 MG CAPS Take 1 capsule by mouth daily.     dexlansoprazole  (DEXILANT ) 60 MG capsule Take 1 capsule (60 mg total) by mouth daily. 90 capsule 1   estradiol (VIVELLE-DOT) 0.05 MG/24HR patch 1 patch 2 (two) times a week.     fenofibrate (TRICOR) 145 MG tablet Take 145 mg by mouth daily.     fluticasone (FLONASE) 50 MCG/ACT nasal spray      hydrochlorothiazide 25 MG tablet Take 25 mg by mouth daily.     hydrocortisone  2.5 % ointment Apply topically 2 (two) times daily. to affected area(s)     metFORMIN (GLUCOPHAGE) 500 MG tablet TAKE 2 TABLETS BY MOUTH TWICE DAILY WITH MEALS (BREAKFAST & SUPPER).     montelukast (SINGULAIR) 10 MG tablet Take 10 mg by mouth daily.     Multiple Vitamin (MULTIVITAMIN) tablet Take 1 tablet by mouth daily.     nitrofurantoin  (MACRODANTIN ) 100 MG capsule Take 1 capsule (100 mg total) by mouth daily. 90 capsule 3   omega-3 fish oil (MAXEPA) 1000 MG CAPS capsule Take by mouth.     OZEMPIC, 2 MG/DOSE, 8 MG/3ML SOPN Inject into the skin.     pramipexole (MIRAPEX) 0.125 MG tablet Take by mouth.     pyridOXINE (VITAMIN B-6) 100 MG tablet Take 200 mg by mouth daily.     rosuvastatin (CRESTOR) 10 MG tablet Take 1 tablet by mouth daily.     sucralfate  (CARAFATE ) 1 g tablet Take 1 tablet (1 g total) by mouth 3 (three) times daily before meals. 90 tablet 5   TRELEGY ELLIPTA 200-62.5-25 MCG/ACT AEPB SMARTSIG:1 inhalation By Mouth Daily     triamcinolone cream (KENALOG)  0.1 % Apply topically 2 (two) times daily. to affected area(s)     vitamin E 400 UNIT capsule Take 800 Units by mouth daily.     Current Facility-Administered Medications  Medication Dose Route Frequency Provider Last Rate Last Admin   0.9 %  sodium chloride  infusion  500 mL Intravenous Once Nandigam, Kavitha V, MD        Allergies as of 12/24/2023 - Review Complete 12/24/2023  Allergen Reaction Noted   Ciprofloxacin Other (See Comments), Rash, Shortness Of Breath, and Nausea And Vomiting 07/19/2015   Sulfa antibiotics Hives, Itching, Other (See Comments), and Rash 07/28/2010   Aspirin Other (See Comments) 07/28/2010   Ciprocin-fluocin-procin [fluocinolone acetonide] Rash 07/28/2010   Enalapril Cough 05/23/2022    Past Medical History:  Diagnosis Date   Allergy    seasonal-takes zyrtec   Depression    Diabetes mellitus    taking metformin   Fatty liver 05/03/2009   seen on CT   GERD (gastroesophageal  reflux disease)    Hyperlipidemia    Hypertension    Kidney stones    Melanoma (HCC)    Sleep apnea    On CPAP   Ureteral calculus 05/03/2009    Past Surgical History:  Procedure Laterality Date   BREAST BIOPSY Right 06/17/2007   CESAREAN SECTION  01/16/1990   DILATION AND CURETTAGE OF UTERUS     ENDOMETRIAL ABLATION W/ NOVASURE     LAPAROSCOPIC HYSTERECTOMY  01/16/2005   with lysis of adhesions   Sling cystourethropexy     TUBAL LIGATION      Review of Systems:    All systems reviewed and negative except where noted in HPI.    Physical Exam:  BP 136/76   Pulse 83   Ht 5' 6 (1.676 m)   Wt 188 lb 6 oz (85.4 kg)   SpO2 97%   BMI 30.40 kg/m  No LMP recorded. Patient has had a hysterectomy.  General: Well-nourished, well-developed in no acute distress.  Lungs: Clear to auscultation bilaterally. Non-labored. Heart: Regular rate and rhythm, no murmurs rubs or gallops.  Abdomen: Bowel sounds are normal; Abdomen is Soft; No hepatosplenomegaly, masses or hernias;  mild to moderate epigastric and periumbilical abdominal Tenderness; there is no RUQ or lower abdominal tenderness.  No guarding or rebound tenderness. Neuro: Alert and oriented x 3.  Grossly intact.  Psych: Alert and cooperative, anxious mood and affect.   Imaging Studies: No results found.  Labs: CBC    Component Value Date/Time   WBC 5.4 11/08/2023 0938   RBC 4.55 11/08/2023 0938   HGB 13.4 11/08/2023 0938   HGB 13.1 09/26/2022 1034   HCT 41.1 11/08/2023 0938   HCT 39.3 09/26/2022 1034   PLT 232.0 11/08/2023 0938   PLT 235 09/26/2022 1034   MCV 90.2 11/08/2023 0938   MCV 92 09/26/2022 1034   MCH 30.7 09/26/2022 1034   MCHC 32.7 11/08/2023 0938   RDW 13.0 11/08/2023 0938   RDW 12.4 09/26/2022 1034   LYMPHSABS 1.5 11/08/2023 0938   LYMPHSABS 1.5 09/26/2022 1034   MONOABS 0.5 11/08/2023 0938   EOSABS 0.1 11/08/2023 0938   EOSABS 0.1 09/26/2022 1034   BASOSABS 0.0 11/08/2023 0938   BASOSABS 0.0 09/26/2022 1034    CMP     Component Value Date/Time   NA 140 11/08/2023 0938   NA 141 09/26/2022 1034   K 4.2 11/08/2023 0938   CL 102 11/08/2023 0938   CO2 30 11/08/2023 0938   GLUCOSE 98 11/08/2023 0938   BUN 10 11/08/2023 0938   BUN 9 09/26/2022 1034   CREATININE 0.49 11/08/2023 0938   CALCIUM 9.8 11/08/2023 0938   PROT 6.8 11/08/2023 0938   PROT 6.6 09/26/2022 1034   ALBUMIN 4.7 11/08/2023 0938   ALBUMIN 4.7 09/26/2022 1034   AST 20 11/08/2023 0938   ALT 18 11/08/2023 0938   ALKPHOS 43 11/08/2023 0938   BILITOT 0.6 11/08/2023 0938   BILITOT 0.8 09/26/2022 1034    Assessment and Plan:   Sian Rockers is a 58 y.o. y/o female returns for follow-up of newly diagnosed grade 1 gastric neuroendocrine tumor polyp (12 mm).  She has chronic upper GI symptoms with dyspepsia and GERD for many years.  Dexilant  has worked the best of all PPIs.  Previously tried and failed pantoprazole , omeprazole , and Prevacid.  She is having tenderness over the epigastrium and Perry  umbilicus.  Ordering abdominal pelvic CT and labs for gastrin and chromogen A.  She will  need surveillance EGD annually. Assessment & Plan Gastric neuroendocrine tumor, grade 1, post-polypectomy Grade 1 neuroendocrine tumor removed via endoscopy. Pathology confirmed complete removal. No metastasis. Symptoms likely due to GERD. - Lab: Gastrin and Chromogranin A - Ordered CT scan of abdomen and pelvis with contrast. - I will consult with Dr. Shila for further management and potential special endoscopic procedure? - Continue annual surveillance upper endoscopy.  Benign gastric polyps, post-resection Multiple benign fundic gland polyps removed. No dysplasia or cancer.  - Consult with Dr. Shila regarding further polyp removal and potential referral to Dr. Wilhelmenia for possible submucosal resection?  Chronic gastritis (H. pylori negative) Moderate gastritis noted, H. pylori negative. - Continue current management and monitor symptoms. - Continue Dexilant  60 mg once daily.  Gastroesophageal reflux disease (GERD) Symptoms consistent with GERD - Continue Dexilant  60 mg daily. - Advised on dietary modifications to avoid trigger foods.  Chronic nausea, vomiting, and diarrhea episodes.  Last episode many months ago.  Suspect she may be having adverse side effect of Ozempic (GLP-1). - If nausea and vomiting episodes recur or persist, then consider holding Ozempic to see if GI symptoms improved.   [name], PA-C  Follow up with Dr. Nandigam in 2 to 3 months.  Also follow-up based on CT and lab results.  I will discuss case with Dr. Nandigam for further disposition.

## 2023-12-24 NOTE — Telephone Encounter (Signed)
 EGD Results: Gastric NET, grade I.

## 2023-12-25 ENCOUNTER — Ambulatory Visit: Admitting: Physician Assistant

## 2023-12-26 ENCOUNTER — Ambulatory Visit: Payer: Self-pay | Admitting: Physician Assistant

## 2023-12-26 LAB — CHROMOGRANIN A: Chromogranin A (ng/mL): 436.9 ng/mL — ABNORMAL HIGH (ref 0.0–101.8)

## 2023-12-26 LAB — GASTRIN: Gastrin: 27 pg/mL (ref <=100)

## 2023-12-26 NOTE — Telephone Encounter (Signed)
Thank you. Discussed results with patient.

## 2023-12-28 ENCOUNTER — Ambulatory Visit: Admitting: Physician Assistant

## 2023-12-31 ENCOUNTER — Ambulatory Visit
Admission: RE | Admit: 2023-12-31 | Discharge: 2023-12-31 | Attending: Physician Assistant | Admitting: Physician Assistant

## 2023-12-31 DIAGNOSIS — C169 Malignant neoplasm of stomach, unspecified: Secondary | ICD-10-CM | POA: Insufficient documentation

## 2023-12-31 MED ORDER — IOHEXOL 300 MG/ML  SOLN
100.0000 mL | Freq: Once | INTRAMUSCULAR | Status: AC | PRN
  Administered 2023-12-31: 14:00:00 100 mL via INTRAVENOUS

## 2024-01-04 ENCOUNTER — Other Ambulatory Visit: Payer: Self-pay

## 2024-01-04 DIAGNOSIS — K219 Gastro-esophageal reflux disease without esophagitis: Secondary | ICD-10-CM

## 2024-01-04 MED ORDER — SUCRALFATE 1 G PO TABS: 1.0000 g | ORAL_TABLET | Freq: Three times a day (TID) | ORAL | 5 refills | Status: AC

## 2024-01-08 ENCOUNTER — Ambulatory Visit: Payer: Self-pay | Admitting: Gastroenterology

## 2024-01-13 NOTE — Progress Notes (Signed)
 Pod A nurse, I sent patient results through MyChart. Please refer patient to urologist for a large kidney stone in the left kidney. Thank you, [name], PA-C

## 2024-01-21 ENCOUNTER — Ambulatory Visit: Admitting: Urology

## 2024-01-21 VITALS — BP 160/82 | HR 78 | Ht 65.0 in | Wt 185.0 lb

## 2024-01-21 DIAGNOSIS — R319 Hematuria, unspecified: Secondary | ICD-10-CM

## 2024-01-21 DIAGNOSIS — N200 Calculus of kidney: Secondary | ICD-10-CM | POA: Diagnosis not present

## 2024-01-21 DIAGNOSIS — N3020 Other chronic cystitis without hematuria: Secondary | ICD-10-CM | POA: Diagnosis not present

## 2024-01-21 NOTE — Progress Notes (Signed)
 "  01/21/2024 8:35 AM   Audrey Hurley 1965/12/10 [id number]  Referring provider: Alla Amis, MD [phone] [facility] [address] [facility] [place],  [place] [address]  Chief Complaint  Patient presents with   Follow-up     Stone in Left Kidney     HPI: I was consulted to assess the patient's 2-3 bladder infections per year.  She gets foul-smelling cloudy urine that respond temporarily to antibiotics.  No pain or frequency.  She said I performed a sling on her in approximately 2008  When she is not infected she is continent and voids every 2 3 hours and gets up at least twice at night  Recent CT scan demonstrated 2 small stones right kidney.  Both stones are 2 mm or less with no hydronephrosis.     Return for cystoscopy for bladder infections and previous sling.  Call if culture positive.  Treat infections as needed versus prophylaxis will be discussed next visit.  Pathophysiology of UTIs discussed.  We have to rule out rare complication of a sling.  I was teasing her that she does not appear to be aging   Last culture positive.  Patient says urine is still cloudy but no burning. Cystoscopy: Normal   Role of prophylaxis discussed first watchful waiting and treat infections as needed.  Call if culture positive.  Patient is going to try probiotics and I will see her in 3 months on Macrodantin  100 mg 30x11.  Pathophysiology of chronic cystitis discussed and is affecting her quality life   Today Frequency stable.  Last week she has had some cloudy urine but no symptoms.  No breakthrough symptoms clinically.  Send urine for culture.  If it is positive I will treat but not change prophylaxis yet.  90 x 3 sent to pharmacy Macrodantin  and see in 1 year   Today Frequency stable.  Last culture negative.  No infections.  Clinically not infected see in 1 year on daily Macrodantin   Today Frequency stable.  Patient says she might of had 2 infections in the last many months on  Macrodantin  but the symptoms cleared up on their own.  Clinically not infected today.  She said she has an abnormality on a recent CT scan.  She was found to have a 10 x 6 x 15 mm stone in the right renal pelvis with some ill-defined thickening and hyperemia of the renal pelvis likely secondary to the stone.  I reviewed the CT scan from 2023 and she had a very small stone at that time.    PMH: Past Medical History:  Diagnosis Date   Allergy    seasonal-takes zyrtec   Depression    Diabetes mellitus    taking metformin   Fatty liver 05/03/2009   seen on CT   GERD (gastroesophageal reflux disease)    Hyperlipidemia    Hypertension    Kidney stones    Melanoma (HCC)    Sleep apnea    On CPAP   Ureteral calculus 05/03/2009    Surgical History: Past Surgical History:  Procedure Laterality Date   BREAST BIOPSY Right 06/17/2007   CESAREAN SECTION  01/16/1990   DILATION AND CURETTAGE OF UTERUS     ENDOMETRIAL ABLATION W/ NOVASURE     LAPAROSCOPIC HYSTERECTOMY  01/16/2005   with lysis of adhesions   Sling cystourethropexy     TUBAL LIGATION      Home Medications:  Allergies as of 01/21/2024       Reactions  Ciprofloxacin Other (See Comments), Rash, Shortness Of Breath, Nausea And Vomiting   Other Reaction(s): hives SOB fever   Sulfa Antibiotics Hives, Itching, Other (See Comments), Rash   RASH, Itching   Aspirin Other (See Comments)   Nose bleeds   Ciprocin-fluocin-procin [fluocinolone Acetonide] Rash   RASH, SOB   Enalapril Cough        Medication List        Accurate as of January 21, 2024  8:35 AM. If you have any questions, ask your nurse or doctor.          pramipexole 0.125 MG tablet Commonly known as: MIRAPEX Take by mouth. The timing of this medication is very important.   amLODipine 5 MG tablet Commonly known as: NORVASC Take 5 mg by mouth.   Belsomra 10 MG Tabs Generic drug: Suvorexant Take 1 tablet by mouth at bedtime as needed.   CALCIUM  600 + D PO Take 1 tablet by mouth 2 (two) times daily. What changed: when to take this   citalopram 40 MG tablet Commonly known as: CELEXA Take 40 mg by mouth daily.   clobetasol ointment 0.05 % Commonly known as: TEMOVATE Apply topically 2 (two) times daily.   Co Q-10 100 MG Caps Take 1 capsule by mouth daily.   dexlansoprazole  60 MG capsule Commonly known as: Dexilant  Take 1 capsule (60 mg total) by mouth daily.   estradiol 0.05 MG/24HR patch Commonly known as: VIVELLE-DOT 1 patch 2 (two) times a week.   fenofibrate 145 MG tablet Commonly known as: TRICOR Take 145 mg by mouth daily.   fluticasone 50 MCG/ACT nasal spray Commonly known as: FLONASE   hydrochlorothiazide 25 MG tablet Commonly known as: HYDRODIURIL Take 25 mg by mouth daily.   hydrocortisone  2.5 % ointment Apply topically 2 (two) times daily. to affected area(s)   metFORMIN 500 MG tablet Commonly known as: GLUCOPHAGE TAKE 2 TABLETS BY MOUTH TWICE DAILY WITH MEALS (BREAKFAST & SUPPER).   montelukast 10 MG tablet Commonly known as: SINGULAIR Take 10 mg by mouth daily.   multivitamin tablet Take 1 tablet by mouth daily.   nitrofurantoin  100 MG capsule Commonly known as: Macrodantin  Take 1 capsule (100 mg total) by mouth daily.   omega-3 fish oil 1000 MG Caps capsule Commonly known as: MAXEPA Take by mouth.   Ozempic (2 MG/DOSE) 8 MG/3ML Sopn Generic drug: Semaglutide (2 MG/DOSE) Inject into the skin.   predniSONE 10 MG tablet Commonly known as: DELTASONE 4 tabs po daily x 3 days, 3 tabs po daily x 3 days, 2 tabs po daily x 3 days, 1 tab po daily x 3 days   pyridOXINE 100 MG tablet Commonly known as: VITAMIN B6 Take 200 mg by mouth daily.   rosuvastatin 10 MG tablet Commonly known as: CRESTOR Take 1 tablet by mouth daily.   sucralfate  1 g tablet Commonly known as: Carafate  Take 1 tablet (1 g total) by mouth 3 (three) times daily before meals.   Trelegy Ellipta 200-62.5-25 MCG/ACT  Aepb Generic drug: Fluticasone-Umeclidin-Vilant SMARTSIG:1 inhalation By Mouth Daily   triamcinolone cream 0.1 % Commonly known as: KENALOG Apply topically 2 (two) times daily. to affected area(s)   vitamin C 1000 MG tablet 1 tablet Orally Once a day   Vitamin D  50 MCG (2000 UT) Caps Take 1 capsule by mouth at bedtime.   vitamin E 180 MG (400 UNITS) capsule Take 800 Units by mouth daily.        Allergies: Allergies[1]  Family History:  Family History  Problem Relation Age of Onset   Breast cancer Mother 70       recurred - metastasized to brain & sternum   Hypertension Mother    Colon polyps Father    Skin cancer Father    Heart disease Maternal Grandfather    Prostate cancer Maternal Grandfather    Heart disease Paternal Grandmother    Hypertension Son    Breast cancer Maternal Aunt 40   Cirrhosis Maternal Aunt    Colon cancer Neg Hx    Rectal cancer Neg Hx    Esophageal cancer Neg Hx    Stomach cancer Neg Hx     Social History:  reports that she has never smoked. She has never been exposed to tobacco smoke. She has never used smokeless tobacco. She reports current alcohol use. She reports that she does not use drugs.  ROS:                                        Physical Exam: BP (!) 160/82   Pulse 78   Ht 5' 5 (1.651 m)   Wt 83.9 kg   BMI 30.79 kg/m   Constitutional:  Alert and oriented, No acute distress.   Laboratory Data: Lab Results  Component Value Date   WBC 5.4 11/08/2023   HGB 13.4 11/08/2023   HCT 41.1 11/08/2023   MCV 90.2 11/08/2023   PLT 232.0 11/08/2023    Lab Results  Component Value Date   CREATININE 0.49 11/08/2023    No results found for: PSA  No results found for: TESTOSTERONE  No results found for: HGBA1C  Urinalysis    Component Value Date/Time   COLORURINE AMBER BIOCHEMICALS MAY BE AFFECTED BY COLOR (A) 05/03/2009 1307   APPEARANCEUR Clear 07/30/2023 1334   LABSPEC 1.026 05/03/2009  1307   PHURINE 6.0 05/03/2009 1307   GLUCOSEU Negative 07/30/2023 1334   HGBUR LARGE (A) 05/03/2009 1307   BILIRUBINUR Negative 07/30/2023 1334   KETONESUR NEGATIVE 05/03/2009 1307   PROTEINUR Negative 07/30/2023 1334   PROTEINUR 30 (A) 05/03/2009 1307   UROBILINOGEN 0.2 05/03/2009 1307   NITRITE Negative 07/30/2023 1334   NITRITE NEGATIVE 05/03/2009 1307   LEUKOCYTESUR Negative 07/30/2023 1334    Pertinent Imaging: Urine reviewed and sent for culture.  Chart reviewed   Assessment & Plan: Picture drawn.  I will have patient see one of my partners who will manage her stone otherwise lithotripsy or ureteroscopy.  The patient will need to have follow-up to make certain that renal pelvic findings normalized.  Call if urine culture positive' patient currently had microscopic hematuria but is asymptomatic.  She agreed with the plan.  I did not ask her if she has a smoking history but understood rare risk of underlying cancer  1. Hematuria, unspecified type (Primary)  - Urinalysis, Complete  2. Chronic cystitis  - Urinalysis, Complete   No follow-ups on file.  Glendia DELENA Elizabeth, MD  [facility] [address] [place], [place] [address] [phone]     [1]  Allergies Allergen Reactions   Ciprofloxacin Other (See Comments), Rash, Shortness Of Breath and Nausea And Vomiting    Other Reaction(s): hives SOB  fever   Sulfa Antibiotics Hives, Itching, Other (See Comments) and Rash    RASH, Itching   Aspirin Other (See Comments)    Nose bleeds   Ciprocin-Fluocin-Procin [Fluocinolone Acetonide]  Rash    RASH, SOB   Enalapril Cough   "

## 2024-01-21 NOTE — Addendum Note (Signed)
 Addended byBETHA CORIE PLATER on: 01/21/2024 09:10 AM   Modules accepted: Orders

## 2024-01-22 LAB — URINALYSIS, COMPLETE
Bilirubin, UA: NEGATIVE
Glucose, UA: NEGATIVE
Ketones, UA: NEGATIVE
Leukocytes,UA: NEGATIVE
Nitrite, UA: NEGATIVE
Protein,UA: NEGATIVE
Specific Gravity, UA: 1.025 (ref 1.005–1.030)
Urobilinogen, Ur: 0.2 mg/dL (ref 0.2–1.0)
pH, UA: 6 (ref 5.0–7.5)

## 2024-01-22 LAB — MICROSCOPIC EXAMINATION: Epithelial Cells (non renal): >10 /HPF — AB (ref 0–10)

## 2024-01-23 LAB — CULTURE, URINE COMPREHENSIVE

## 2024-01-24 DIAGNOSIS — N200 Calculus of kidney: Secondary | ICD-10-CM | POA: Insufficient documentation

## 2024-01-24 NOTE — H&P (View-Only) (Signed)
 "  01/30/2024 8:50 AM   Audrey Hurley 1965/06/14 [id number]  Reason for visit: Follow up nephrolithiasis   HPI: 59 y.o. female, initial follow up with me today, previously seen by Dr. Gaston  First time meeting, accompanied by husband, very pleasant people Incidental right renal pelvic stone (found via CT for gastric tumor surveillance)  - 1.5cm right renal pelvic stone with mild right pelviectasis, renal pelvic enhancement  - Asymptomatic, no renal colic, no UTI sx  - Remote history of 1 prior stone event (10+ years ago, spontaneously passed)  - No prior stone surgeries   Prior HPI: Hx of rUTI   - ~2-3 UTI / year  Hx of UI  - s/p prior MUS (~2008)   CT A/P (Dec 2025)     Physical Exam: BP (!) 151/81   Pulse 77   Ht 5' 5 (1.651 m)   Wt 195 lb (88.5 kg)   BMI 32.45 kg/m    General: no acute distress, alert/oriented, conversational  HEENT: equal nondilated pupils CV: regular rate Lung: unlabored breathing, regular rate and rhythm  Abd: nondistended, nontender with palpation, no palpable masses  MSK: moving all extremities without issue, normal observed motor function   Laboratory Data:  Latest Reference Range & Units 01/21/24 08:33  Appearance Ur Clear  Clear  Bilirubin, UA Negative  Negative  Color, UA Yellow  Yellow  Glucose, UA Negative  Negative  Ketones, UA Negative  Negative  Leukocytes,UA Negative  Negative  Nitrite, UA Negative  Negative  pH, UA 5.0 - 7.5  6.0  Protein,UA Negative/Trace  Negative  Specific Gravity, UA 1.005 - 1.030  1.025  RBC, UA Negative  1+ !  Bacteria, UA None seen/Few  Few  Cast Type N/A  Hyaline casts  Casts None seen /lpf Present !  Epithelial Cells (non renal) 0 - 10 /hpf >10 !  Microscopic Examination  See below:  Mucus, UA Not Estab.  Present !  RBC, Urine 0 - 2 /hpf 11-30 !  WBC, UA 0 - 5 /hpf 0-5  !: Data is abnormal  Pertinent Imaging: I have personally viewed and interpreted the CT A/P (01/10/24)  -   IMPRESSION: 1. No acute findings in the abdomen or pelvis. Specifically, no findings to suggest recurrent or metastatic disease. 2. 10 x 6 x 15 mm stone in the right renal pelvis with some ill-defined wall thickening and hyperemia in the wall of the renal pelvis, likely secondary to irritation from the stone. Urology follow-up recommended as primary urothelial lesion could present similarly. 3.  Aortic Atherosclerosis (ICD10-I70.0).SABRA    Assessment & Plan:    Nephrolithiasis Assessment & Plan: 1.5 cm Right renal pelvis stone with pelviectasis (incidentally noted via CT, Dec 2025)  - ~700 HU, skin-stone 12cm  - Asymptomatic  Reviewed her clinical history and recent CT imaging.  Considering total stone burden, would recommend definitive preemptive treatment.  Reviewed 2 modalities including ESWL and ureteroscopic lithotripsy.  I think she would be a reasonable candidate for both.  She was more aligned with surgery (had a friend with a poor experience with ESWL).  As such, reviewed a right ureteroscopy laser lithotripsy in detail today.  Would anticipate a 1 hour outpatient surgery.  Considering stone size this may require staging and a second procedure.  Additionally, reviewed the possibility stent placement only with passive dilation.  All questions answered today and she was wishing to proceed.  - Scheduled for OR, next available: Right ureteroscopy, laser lithotripsy, stone basket  extraction, possible right ureteral stent placement  Orders: -     Ambulatory Referral For Surgery Scheduling       Penne JONELLE Skye, MD  [facility] [address] [place], [place] [address] [phone] "

## 2024-01-24 NOTE — Assessment & Plan Note (Signed)
 1.5 cm Right renal pelvis stone with pelviectasis  - ~700 HU, skin-stone 12cm

## 2024-01-24 NOTE — Progress Notes (Unsigned)
 "  01/30/2024 8:50 AM   Audrey Hurley 1965/06/14 [id number]  Reason for visit: Follow up nephrolithiasis   HPI: 59 y.o. female, initial follow up with me today, previously seen by Dr. Gaston  First time meeting, accompanied by husband, very pleasant people Incidental right renal pelvic stone (found via CT for gastric tumor surveillance)  - 1.5cm right renal pelvic stone with mild right pelviectasis, renal pelvic enhancement  - Asymptomatic, no renal colic, no UTI sx  - Remote history of 1 prior stone event (10+ years ago, spontaneously passed)  - No prior stone surgeries   Prior HPI: Hx of rUTI   - ~2-3 UTI / year  Hx of UI  - s/p prior MUS (~2008)   CT A/P (Dec 2025)     Physical Exam: BP (!) 151/81   Pulse 77   Ht 5' 5 (1.651 m)   Wt 195 lb (88.5 kg)   BMI 32.45 kg/m    General: no acute distress, alert/oriented, conversational  HEENT: equal nondilated pupils CV: regular rate Lung: unlabored breathing, regular rate and rhythm  Abd: nondistended, nontender with palpation, no palpable masses  MSK: moving all extremities without issue, normal observed motor function   Laboratory Data:  Latest Reference Range & Units 01/21/24 08:33  Appearance Ur Clear  Clear  Bilirubin, UA Negative  Negative  Color, UA Yellow  Yellow  Glucose, UA Negative  Negative  Ketones, UA Negative  Negative  Leukocytes,UA Negative  Negative  Nitrite, UA Negative  Negative  pH, UA 5.0 - 7.5  6.0  Protein,UA Negative/Trace  Negative  Specific Gravity, UA 1.005 - 1.030  1.025  RBC, UA Negative  1+ !  Bacteria, UA None seen/Few  Few  Cast Type N/A  Hyaline casts  Casts None seen /lpf Present !  Epithelial Cells (non renal) 0 - 10 /hpf >10 !  Microscopic Examination  See below:  Mucus, UA Not Estab.  Present !  RBC, Urine 0 - 2 /hpf 11-30 !  WBC, UA 0 - 5 /hpf 0-5  !: Data is abnormal  Pertinent Imaging: I have personally viewed and interpreted the CT A/P (01/10/24)  -   IMPRESSION: 1. No acute findings in the abdomen or pelvis. Specifically, no findings to suggest recurrent or metastatic disease. 2. 10 x 6 x 15 mm stone in the right renal pelvis with some ill-defined wall thickening and hyperemia in the wall of the renal pelvis, likely secondary to irritation from the stone. Urology follow-up recommended as primary urothelial lesion could present similarly. 3.  Aortic Atherosclerosis (ICD10-I70.0).SABRA    Assessment & Plan:    Nephrolithiasis Assessment & Plan: 1.5 cm Right renal pelvis stone with pelviectasis (incidentally noted via CT, Dec 2025)  - ~700 HU, skin-stone 12cm  - Asymptomatic  Reviewed her clinical history and recent CT imaging.  Considering total stone burden, would recommend definitive preemptive treatment.  Reviewed 2 modalities including ESWL and ureteroscopic lithotripsy.  I think she would be a reasonable candidate for both.  She was more aligned with surgery (had a friend with a poor experience with ESWL).  As such, reviewed a right ureteroscopy laser lithotripsy in detail today.  Would anticipate a 1 hour outpatient surgery.  Considering stone size this may require staging and a second procedure.  Additionally, reviewed the possibility stent placement only with passive dilation.  All questions answered today and she was wishing to proceed.  - Scheduled for OR, next available: Right ureteroscopy, laser lithotripsy, stone basket  extraction, possible right ureteral stent placement  Orders: -     Ambulatory Referral For Surgery Scheduling       Audrey JONELLE Skye, MD  [facility] [address] [place], [place] [address] [phone] "

## 2024-01-24 NOTE — H&P (View-Only) (Signed)
 "  01/30/2024 8:50 AM   Audrey Hurley 1965/06/14 [id number]  Reason for visit: Follow up nephrolithiasis   HPI: 59 y.o. female, initial follow up with me today, previously seen by Dr. Gaston  First time meeting, accompanied by husband, very pleasant people Incidental right renal pelvic stone (found via CT for gastric tumor surveillance)  - 1.5cm right renal pelvic stone with mild right pelviectasis, renal pelvic enhancement  - Asymptomatic, no renal colic, no UTI sx  - Remote history of 1 prior stone event (10+ years ago, spontaneously passed)  - No prior stone surgeries   Prior HPI: Hx of rUTI   - ~2-3 UTI / year  Hx of UI  - s/p prior MUS (~2008)   CT A/P (Dec 2025)     Physical Exam: BP (!) 151/81   Pulse 77   Ht 5' 5 (1.651 m)   Wt 195 lb (88.5 kg)   BMI 32.45 kg/m    General: no acute distress, alert/oriented, conversational  HEENT: equal nondilated pupils CV: regular rate Lung: unlabored breathing, regular rate and rhythm  Abd: nondistended, nontender with palpation, no palpable masses  MSK: moving all extremities without issue, normal observed motor function   Laboratory Data:  Latest Reference Range & Units 01/21/24 08:33  Appearance Ur Clear  Clear  Bilirubin, UA Negative  Negative  Color, UA Yellow  Yellow  Glucose, UA Negative  Negative  Ketones, UA Negative  Negative  Leukocytes,UA Negative  Negative  Nitrite, UA Negative  Negative  pH, UA 5.0 - 7.5  6.0  Protein,UA Negative/Trace  Negative  Specific Gravity, UA 1.005 - 1.030  1.025  RBC, UA Negative  1+ !  Bacteria, UA None seen/Few  Few  Cast Type N/A  Hyaline casts  Casts None seen /lpf Present !  Epithelial Cells (non renal) 0 - 10 /hpf >10 !  Microscopic Examination  See below:  Mucus, UA Not Estab.  Present !  RBC, Urine 0 - 2 /hpf 11-30 !  WBC, UA 0 - 5 /hpf 0-5  !: Data is abnormal  Pertinent Imaging: I have personally viewed and interpreted the CT A/P (01/10/24)  -   IMPRESSION: 1. No acute findings in the abdomen or pelvis. Specifically, no findings to suggest recurrent or metastatic disease. 2. 10 x 6 x 15 mm stone in the right renal pelvis with some ill-defined wall thickening and hyperemia in the wall of the renal pelvis, likely secondary to irritation from the stone. Urology follow-up recommended as primary urothelial lesion could present similarly. 3.  Aortic Atherosclerosis (ICD10-I70.0).Audrey Hurley    Assessment & Plan:    Nephrolithiasis Assessment & Plan: 1.5 cm Right renal pelvis stone with pelviectasis (incidentally noted via CT, Dec 2025)  - ~700 HU, skin-stone 12cm  - Asymptomatic  Reviewed her clinical history and recent CT imaging.  Considering total stone burden, would recommend definitive preemptive treatment.  Reviewed 2 modalities including ESWL and ureteroscopic lithotripsy.  I think she would be a reasonable candidate for both.  She was more aligned with surgery (had a friend with a poor experience with ESWL).  As such, reviewed a right ureteroscopy laser lithotripsy in detail today.  Would anticipate a 1 hour outpatient surgery.  Considering stone size this may require staging and a second procedure.  Additionally, reviewed the possibility stent placement only with passive dilation.  All questions answered today and she was wishing to proceed.  - Scheduled for OR, next available: Right ureteroscopy, laser lithotripsy, stone basket  extraction, possible right ureteral stent placement  Orders: -     Ambulatory Referral For Surgery Scheduling       Penne JONELLE Skye, MD  [facility] [address] [place], [place] [address] [phone] "

## 2024-01-29 ENCOUNTER — Other Ambulatory Visit: Payer: Self-pay | Admitting: Family Medicine

## 2024-01-29 DIAGNOSIS — Z1231 Encounter for screening mammogram for malignant neoplasm of breast: Secondary | ICD-10-CM

## 2024-01-30 ENCOUNTER — Encounter: Payer: Self-pay | Admitting: Urology

## 2024-01-30 ENCOUNTER — Other Ambulatory Visit: Payer: Self-pay

## 2024-01-30 ENCOUNTER — Ambulatory Visit: Admitting: Gastroenterology

## 2024-01-30 ENCOUNTER — Ambulatory Visit: Admitting: Urology

## 2024-01-30 VITALS — BP 151/81 | HR 77 | Ht 65.0 in | Wt 195.0 lb

## 2024-01-30 DIAGNOSIS — N200 Calculus of kidney: Secondary | ICD-10-CM | POA: Diagnosis not present

## 2024-01-30 NOTE — Progress Notes (Signed)
 Surgical Physician Order Form [facility] [place]  Dr. Glendia Barba, MD  * Scheduling expectation : Next Available  *Length of Case: 1 hour  *Clearance needed: no  *Anticoagulation Instructions: N/A  *Aspirin Instructions: N/A  *Post-op visit Date/Instructions:  1 month with RUS prior  *Diagnosis: Right Nephrolithiasis  *Procedure: right Ureteroscopy w/laser lithotripsy & stent placement ([id number])   Additional orders: N/A  -Admit type: OUTpatient  -Anesthesia: General  -VTE Prophylaxis Standing Order SCD's       Other:   -Standing Lab Orders Per Anesthesia    Lab other: UA&Urine Culture  -Standing Test orders EKG/Chest x-ray per Anesthesia       Test other:   - Medications:  Ancef 2gm IV  -Other orders:  N/A

## 2024-01-31 ENCOUNTER — Telehealth: Payer: Self-pay

## 2024-01-31 NOTE — Progress Notes (Signed)
" ° °   Urology- Surgical Posting Form  Surgery Date: Date: 02/07/2024  Surgeon: Dr. Glendia Barba, MD  Inpt ( No  )   Outpt (Yes)   Obs ( No  )   Diagnosis: N20.0 Right Nephrolithiasis  -CPT: [phone]  Surgery: Right Ureteroscopy with Laser Lithotripsy and Stent Placement  Stop Anticoagulations: No, may continue all  Cardiac/Medical/Pulmonary Clearance needed: no  Patient is aware to hold OZEMPIC 1 week prior to surgery.   *Orders entered into EPIC  Date: 01/31/24   *Case booked in EPIC  Date: 01/31/24  *Notified pt of Surgery: Date: 01/31/24  PRE-OP UA & CX: yes, obtained in clinic on 01/21/2024  *Placed into Prior Authorization Work [place] Date: 01/31/24  Assistant/laser/rep:No                "

## 2024-01-31 NOTE — Telephone Encounter (Signed)
 Per Dr. Twylla, Patient is to be scheduled for Right Ureteroscopy with Laser Lithotripsy and Stent Placement   Audrey Hurley was contacted and possible surgical dates were discussed, Thursday January 29th, 2026 was agreed upon for surgery.   Patient was directed to call [phone] between 1-3pm the day before surgery to find out surgical arrival time.  Instructions were given not to eat or drink from midnight on the night before surgery and have a driver for the day of surgery. On the surgery day patient was instructed to enter through the [facility] entrance of [facility] report the Same Day Surgery desk.   Pre-Admit Testing will be in contact via phone to set up an interview with the anesthesia team to review your history and medications prior to surgery.   Reminder of this information was sent via MyChart to the patient.

## 2024-02-04 ENCOUNTER — Other Ambulatory Visit: Payer: Self-pay

## 2024-02-04 ENCOUNTER — Encounter
Admission: RE | Admit: 2024-02-04 | Discharge: 2024-02-04 | Disposition: A | Source: Ambulatory Visit | Attending: Urology | Admitting: Urology

## 2024-02-04 ENCOUNTER — Encounter
Admission: RE | Admit: 2024-02-04 | Discharge: 2024-02-04 | Disposition: A | Discharge status: Home / Self Care | Source: Ambulatory Visit | Attending: Urology | Admitting: Urology

## 2024-02-04 DIAGNOSIS — N200 Calculus of kidney: Secondary | ICD-10-CM

## 2024-02-04 DIAGNOSIS — Z01810 Encounter for preprocedural cardiovascular examination: Secondary | ICD-10-CM | POA: Insufficient documentation

## 2024-02-04 DIAGNOSIS — I152 Hypertension secondary to endocrine disorders: Secondary | ICD-10-CM | POA: Diagnosis not present

## 2024-02-04 DIAGNOSIS — E119 Type 2 diabetes mellitus without complications: Secondary | ICD-10-CM

## 2024-02-04 DIAGNOSIS — E1159 Type 2 diabetes mellitus with other circulatory complications: Secondary | ICD-10-CM | POA: Diagnosis not present

## 2024-02-04 DIAGNOSIS — Z01818 Encounter for other preprocedural examination: Secondary | ICD-10-CM | POA: Diagnosis present

## 2024-02-04 HISTORY — DX: Personal history of urinary calculi: Z87.442

## 2024-02-04 HISTORY — DX: Pneumonia, unspecified organism: J18.9

## 2024-02-04 HISTORY — DX: Unspecified asthma, uncomplicated: J45.909

## 2024-02-04 NOTE — Patient Instructions (Addendum)
 Your procedure is scheduled on: 02/07/24 - Thursday Report to the Registration Desk on the 1st floor of the Medical Mall. To find out your arrival time, please call [phone] between 1PM - 3PM on: 02/06/24 - Wednesday If your arrival time is 6:00 am, do not arrive before that time as the Medical Mall entrance doors do not open until 6:00 am.  REMEMBER: Instructions that are not followed completely may result in serious medical risk, up to and including death; or upon the discretion of your surgeon and anesthesiologist your surgery may need to be rescheduled.  Do not eat food or drink any liquids after midnight the night before surgery.  No gum chewing or hard candies.  One week prior to surgery: Stop Anti-inflammatories (NSAIDS) such as Advil, Aleve, Ibuprofen, Motrin, Naproxen, Naprosyn and Aspirin based products such as Excedrin, Goody's Powder, BC Powder. You may take Tylenol  if needed for pain up until the day of surgery.  Stop ANY OVER THE COUNTER supplements until after surgery.  metFORMIN (GLUCOPHAGE) hold beginning 02/05/24.  OZEMPIC hold 7 days prior to your surgery.  ON THE DAY OF SURGERY ONLY TAKE THESE MEDICATIONS WITH SIPS OF WATER :   amLODipine (NORVASC)  TRELEGY ELLIPTA    No Alcohol for 24 hours before or after surgery.  No Smoking including e-cigarettes for 24 hours before surgery.  No chewable tobacco products for at least 6 hours before surgery.  No nicotine patches on the day of surgery.  Do not use any recreational drugs for at least a week (preferably 2 weeks) before your surgery.  Please be advised that the combination of cocaine and anesthesia may have negative outcomes, up to and including death. If you test positive for cocaine, your surgery will be cancelled.  On the morning of surgery brush your teeth with toothpaste and water , you may rinse your mouth with mouthwash if you wish. Do not swallow any toothpaste or mouthwash.  Do not wear  jewelry, make-up, hairpins, clips or nail polish.  For welded (permanent) jewelry: bracelets, anklets, waist bands, etc.  Please have this removed prior to surgery.  If it is not removed, there is a chance that hospital personnel will need to cut it off on the day of surgery.  Do not wear lotions, powders, or perfumes.   Do not shave body hair from the neck down 48 hours before surgery.  Contact lenses, hearing aids and dentures may not be worn into surgery.  Do not bring valuables to the hospital. [facility] is not responsible for any missing/lost belongings or valuables.   Notify your doctor if there is any change in your medical condition (cold, fever, infection).  Wear comfortable clothing (specific to your surgery type) to the hospital.  After surgery, you can help prevent lung complications by doing breathing exercises.  Take deep breaths and cough every 1-2 hours. Your doctor may order a device called an Incentive Spirometer to help you take deep breaths.  When coughing or sneezing, hold a pillow firmly against your incision with both hands. This is called splinting. Doing this helps protect your incision. It also decreases belly discomfort.  If you are being admitted to the hospital overnight, leave your suitcase in the car. After surgery it may be brought to your room.  In case of increased patient census, it may be necessary for you, the patient, to continue your postoperative care in the Same Day Surgery department.  If you are being discharged the day of surgery, you will  not be allowed to drive home. You will need a responsible individual to drive you home and stay with you for 24 hours after surgery.   If you are taking public transportation, you will need to have a responsible individual with you.  Please call the Pre-admissions Testing Dept. at [phone] if you have any questions about these instructions.  Surgery Visitation Policy:  Patients having surgery  or a procedure may have two visitors.  Children under the age of 74 must have an adult with them who is not the patient.  Inpatient Visitation:    Visiting hours are 7 a.m. to 8 p.m. Up to four visitors are allowed at one time in a patient room. The visitors may rotate out with other people during the day.  One visitor age 39 or older may stay with the patient overnight and must be in the room by 8 p.m.   Merchandiser, Retail to address health-related social needs:  [email or web address]

## 2024-02-06 NOTE — Anesthesia Preprocedure Evaluation (Addendum)
"                                    Anesthesia Evaluation  Patient identified by MRN, date of birth, ID band Patient awake    Reviewed: Allergy & Precautions, H&P , NPO status , Patient's Chart, lab work & pertinent test results  Airway Mallampati: III  TM Distance: >3 FB Neck ROM: full    Dental no notable dental hx.    Pulmonary asthma (Moderate persistent) , sleep apnea    Pulmonary exam normal        Cardiovascular hypertension, Normal cardiovascular exam     Neuro/Psych  PSYCHIATRIC DISORDERS  Depression    negative neurological ROS     GI/Hepatic negative GI ROS, Neg liver ROS,,,  Endo/Other  diabetes, Well Controlled, Type 2    Renal/GU Renal disease     Musculoskeletal   Abdominal   Peds  Hematology negative hematology ROS (+)   Anesthesia Other Findings Past Medical History: No date: Allergy     Comment:  seasonal-takes zyrtec No date: Asthma No date: Depression No date: Diabetes mellitus     Comment:  taking metformin 05/03/2009: Fatty liver     Comment:  seen on CT No date: GERD (gastroesophageal reflux disease) No date: History of kidney stones No date: Hyperlipidemia No date: Hypertension No date: Kidney stones No date: Melanoma (HCC) No date: Pneumonia No date: Sleep apnea     Comment:  no cpap 05/03/2009: Ureteral calculus  Past Surgical History: 06/17/2007: BREAST BIOPSY; Right 01/16/1990: CESAREAN SECTION No date: COLONOSCOPY No date: DILATION AND CURETTAGE OF UTERUS No date: ENDOMETRIAL ABLATION W/ NOVASURE No date: ESOPHAGOGASTRODUODENOSCOPY     Comment:  x 2 01/16/2005: LAPAROSCOPIC HYSTERECTOMY     Comment:  with lysis of adhesions No date: Sling cystourethropexy No date: TUBAL LIGATION     Reproductive/Obstetrics negative OB ROS                              Anesthesia Physical Anesthesia Plan  ASA: 2  Anesthesia Plan: General   Post-op Pain Management: Toradol IV  (intra-op)* and Tylenol  PO (pre-op)*   Induction: Intravenous  PONV Risk Score and Plan: Dexamethasone, Ondansetron , Midazolam  and Treatment may vary due to age or medical condition  Airway Management Planned: Oral ETT and LMA  Additional Equipment:   Intra-op Plan:   Post-operative Plan: Extubation in OR  Informed Consent: I have reviewed the patients History and Physical, chart, labs and discussed the procedure including the risks, benefits and alternatives for the proposed anesthesia with the patient or authorized representative who has indicated his/her understanding and acceptance.     Dental Advisory Given  Plan Discussed with: Anesthesiologist, CRNA and Surgeon  Anesthesia Plan Comments:          Anesthesia Quick Evaluation  "

## 2024-02-07 ENCOUNTER — Ambulatory Visit

## 2024-02-07 ENCOUNTER — Ambulatory Visit
Admission: RE | Admit: 2024-02-07 | Discharge: 2024-02-07 | Disposition: A | Discharge status: Home / Self Care | Attending: Urology | Admitting: Urology

## 2024-02-07 ENCOUNTER — Ambulatory Visit: Payer: Self-pay | Admitting: Anesthesiology

## 2024-02-07 ENCOUNTER — Encounter
Admission: RE | Disposition: A | Discharge status: Home / Self Care | Payer: Self-pay | Source: Home / Self Care | Attending: Urology

## 2024-02-07 ENCOUNTER — Encounter: Payer: Self-pay | Admitting: Anesthesiology

## 2024-02-07 ENCOUNTER — Other Ambulatory Visit: Payer: Self-pay

## 2024-02-07 ENCOUNTER — Encounter: Payer: Self-pay | Admitting: Urology

## 2024-02-07 DIAGNOSIS — N200 Calculus of kidney: Secondary | ICD-10-CM | POA: Diagnosis present

## 2024-02-07 DIAGNOSIS — G4730 Sleep apnea, unspecified: Secondary | ICD-10-CM | POA: Diagnosis not present

## 2024-02-07 DIAGNOSIS — E119 Type 2 diabetes mellitus without complications: Secondary | ICD-10-CM | POA: Diagnosis not present

## 2024-02-07 DIAGNOSIS — Z87440 Personal history of urinary (tract) infections: Secondary | ICD-10-CM | POA: Insufficient documentation

## 2024-02-07 DIAGNOSIS — I10 Essential (primary) hypertension: Secondary | ICD-10-CM | POA: Insufficient documentation

## 2024-02-07 DIAGNOSIS — Z7984 Long term (current) use of oral hypoglycemic drugs: Secondary | ICD-10-CM | POA: Insufficient documentation

## 2024-02-07 LAB — GLUCOSE, CAPILLARY
Glucose-Capillary: 126 mg/dL — ABNORMAL HIGH (ref 70–99)
Glucose-Capillary: 130 mg/dL — ABNORMAL HIGH (ref 70–99)

## 2024-02-07 MED ORDER — ACETAMINOPHEN 500 MG PO TABS
ORAL_TABLET | ORAL | Status: AC
  Filled 2024-02-07: qty 2

## 2024-02-07 MED ORDER — CHLORHEXIDINE GLUCONATE 0.12 % MT SOLN
OROMUCOSAL | Status: AC
  Filled 2024-02-07: qty 15

## 2024-02-07 MED ORDER — DEXMEDETOMIDINE HCL IN NACL 80 MCG/20ML IV SOLN
INTRAVENOUS | Status: AC
  Filled 2024-02-07: qty 20

## 2024-02-07 MED ORDER — DEXMEDETOMIDINE HCL IN NACL 80 MCG/20ML IV SOLN
INTRAVENOUS | Status: DC | PRN
  Administered 2024-02-07: 12 ug via INTRAVENOUS
  Administered 2024-02-07: 8 ug via INTRAVENOUS

## 2024-02-07 MED ORDER — BELLADONNA ALKALOIDS-OPIUM 16.2-60 MG RE SUPP
RECTAL | Status: AC
  Filled 2024-02-07: qty 1

## 2024-02-07 MED ORDER — OXYBUTYNIN CHLORIDE 5 MG PO TABS: ORAL_TABLET | ORAL | 0 refills | Status: DC

## 2024-02-07 MED ORDER — IOHEXOL 180 MG/ML  SOLN
INTRAMUSCULAR | Status: DC | PRN
  Administered 2024-02-07: 10 mL

## 2024-02-07 MED ORDER — OXYCODONE HCL 5 MG PO TABS
5.0000 mg | ORAL_TABLET | Freq: Once | ORAL | Status: AC | PRN
  Administered 2024-02-07: 5 mg via ORAL

## 2024-02-07 MED ORDER — LIDOCAINE HCL (CARDIAC) PF 100 MG/5ML IV SOSY
PREFILLED_SYRINGE | INTRAVENOUS | Status: DC | PRN
  Administered 2024-02-07: 100 mg via INTRAVENOUS

## 2024-02-07 MED ORDER — MIDAZOLAM HCL 2 MG/2ML IJ SOLN
INTRAMUSCULAR | Status: AC
  Filled 2024-02-07: qty 2

## 2024-02-07 MED ORDER — PHENYLEPHRINE 80 MCG/ML (10ML) SYRINGE FOR IV PUSH (FOR BLOOD PRESSURE SUPPORT)
PREFILLED_SYRINGE | INTRAVENOUS | Status: DC | PRN
  Administered 2024-02-07 (×2): 240 ug via INTRAVENOUS

## 2024-02-07 MED ORDER — STERILE WATER FOR IRRIGATION IR SOLN
Status: DC | PRN
  Administered 2024-02-07: 500 mL

## 2024-02-07 MED ORDER — CEFAZOLIN SODIUM-DEXTROSE 2-4 GM/100ML-% IV SOLN
INTRAVENOUS | Status: AC
  Filled 2024-02-07: qty 100

## 2024-02-07 MED ORDER — FENTANYL CITRATE (PF) 100 MCG/2ML IJ SOLN: 25.0000 ug | INTRAMUSCULAR | Status: DC | PRN

## 2024-02-07 MED ORDER — MIDAZOLAM HCL (PF) 2 MG/2ML IJ SOLN
INTRAMUSCULAR | Status: DC | PRN
  Administered 2024-02-07: 2 mg via INTRAVENOUS

## 2024-02-07 MED ORDER — SODIUM CHLORIDE 0.9 % IV SOLN: INTRAVENOUS | Status: DC

## 2024-02-07 MED ORDER — TAMSULOSIN HCL 0.4 MG PO CAPS: 0.4000 mg | ORAL_CAPSULE | Freq: Every day | ORAL | 0 refills | Status: AC

## 2024-02-07 MED ORDER — ACETAMINOPHEN 500 MG PO TABS
1000.0000 mg | ORAL_TABLET | Freq: Once | ORAL | Status: AC
  Administered 2024-02-07: 1000 mg via ORAL

## 2024-02-07 MED ORDER — DROPERIDOL 2.5 MG/ML IJ SOLN: 0.6250 mg | Freq: Once | INTRAMUSCULAR | Status: DC | PRN

## 2024-02-07 MED ORDER — SODIUM CHLORIDE 0.9 % IR SOLN
Status: DC | PRN
  Administered 2024-02-07: 3000 mL

## 2024-02-07 MED ORDER — HYDROCODONE-ACETAMINOPHEN 5-325 MG PO TABS: 1.0000 | ORAL_TABLET | Freq: Four times a day (QID) | ORAL | 0 refills | Status: DC | PRN

## 2024-02-07 MED ORDER — ONDANSETRON HCL 4 MG/2ML IJ SOLN
INTRAMUSCULAR | Status: AC
  Filled 2024-02-07: qty 2

## 2024-02-07 MED ORDER — PROPOFOL 10 MG/ML IV BOLUS
INTRAVENOUS | Status: DC | PRN
  Administered 2024-02-07: 200 mg via INTRAVENOUS
  Administered 2024-02-07: 50 ug/kg/min via INTRAVENOUS

## 2024-02-07 MED ORDER — ONDANSETRON HCL 4 MG/2ML IJ SOLN
INTRAMUSCULAR | Status: DC | PRN
  Administered 2024-02-07: 4 mg via INTRAVENOUS

## 2024-02-07 MED ORDER — PHENYLEPHRINE 80 MCG/ML (10ML) SYRINGE FOR IV PUSH (FOR BLOOD PRESSURE SUPPORT)
PREFILLED_SYRINGE | INTRAVENOUS | Status: AC
  Filled 2024-02-07: qty 10

## 2024-02-07 MED ORDER — BELLADONNA ALKALOIDS-OPIUM 16.2-60 MG RE SUPP
RECTAL | Status: DC | PRN
  Administered 2024-02-07: 1 via RECTAL

## 2024-02-07 MED ORDER — LIDOCAINE HCL (PF) 2 % IJ SOLN
INTRAMUSCULAR | Status: AC
  Filled 2024-02-07: qty 5

## 2024-02-07 MED ORDER — ORAL CARE MOUTH RINSE: 15.0000 mL | Freq: Once | OROMUCOSAL | Status: AC

## 2024-02-07 MED ORDER — ACETAMINOPHEN 10 MG/ML IV SOLN: 1000.0000 mg | Freq: Once | INTRAVENOUS | Status: DC | PRN

## 2024-02-07 MED ORDER — CEFAZOLIN SODIUM-DEXTROSE 2-4 GM/100ML-% IV SOLN
2.0000 g | INTRAVENOUS | Status: AC
  Administered 2024-02-07: 2 g via INTRAVENOUS

## 2024-02-07 MED ORDER — OXYCODONE HCL 5 MG/5ML PO SOLN: 5.0000 mg | Freq: Once | ORAL | Status: AC | PRN

## 2024-02-07 MED ORDER — PROPOFOL 1000 MG/100ML IV EMUL
INTRAVENOUS | Status: AC
  Filled 2024-02-07: qty 100

## 2024-02-07 MED ORDER — OXYCODONE HCL 5 MG PO TABS
ORAL_TABLET | ORAL | Status: AC
  Filled 2024-02-07: qty 1

## 2024-02-07 MED ORDER — CHLORHEXIDINE GLUCONATE 0.12 % MT SOLN
15.0000 mL | Freq: Once | OROMUCOSAL | Status: AC
  Administered 2024-02-07: 15 mL via OROMUCOSAL

## 2024-02-07 NOTE — Transfer of Care (Signed)
 Immediate Anesthesia Transfer of Care Note  Patient: Audrey Hurley  Procedure(s) Performed: CYSTOSCOPY, WITH RETROGRADE PYELOGRAM AND URETERAL STENT INSERTION (Right: Ureter)  Patient Location: PACU  Anesthesia Type:General  Level of Consciousness: sedated and drowsy  Airway & Oxygen Therapy: Patient Spontanous Breathing and Patient connected to nasal cannula oxygen  Post-op Assessment: Report given to RN and Post -op Vital signs reviewed and stable  Post vital signs: stable  Last Vitals:  Vitals Value Taken Time  BP 104/71 02/07/24 08:17  Temp 36.6 C 02/07/24 08:17  Pulse 74 02/07/24 08:21  Resp 17 02/07/24 08:21  SpO2 94 % 02/07/24 08:21  Vitals shown include unfiled device data.  Last Pain:  Vitals:   02/07/24 0817  PainSc: Asleep         Complications: There were no known notable events for this encounter.

## 2024-02-07 NOTE — Anesthesia Postprocedure Evaluation (Signed)
"   Anesthesia Post Note  Patient: Audrey Hurley  Procedure(s) Performed: CYSTOSCOPY, WITH RETROGRADE PYELOGRAM AND URETERAL STENT INSERTION (Right: Ureter)  Patient location during evaluation: PACU Anesthesia Type: General Level of consciousness: awake and alert Pain management: pain level controlled Vital Signs Assessment: post-procedure vital signs reviewed and stable Respiratory status: spontaneous breathing, nonlabored ventilation and respiratory function stable Cardiovascular status: blood pressure returned to baseline and stable Postop Assessment: no apparent nausea or vomiting Anesthetic complications: no   There were no known notable events for this encounter.   Last Vitals:  Vitals:   02/07/24 0848 02/07/24 0908  BP: 114/82 124/69  Pulse: 65 61  Resp: 14 16  Temp: 36.4 C 36.4 C  SpO2: 99% 100%    Last Pain:  Vitals:   02/07/24 0908  TempSrc: Temporal  PainSc: 0-No pain                 [name]      "

## 2024-02-07 NOTE — Discharge Instructions (Addendum)
 DISCHARGE INSTRUCTIONS FOR KIDNEY STONE/URETERAL STENT   MEDICATIONS:  1. Resume all your other meds from home.  2.  AZO (over-the-counter) can help with the burning/stinging when you urinate. 3.  Hydrocodone  is for moderate/severe pain, Rx was sent to your pharmacy. 4.  Tamsulosin  and oxybutynin  are for bladder/stent irritation.  ACTIVITY:  1. May resume regular activities in 24 hours. 2. No driving while on narcotic pain medications  3. Drink plenty of water   4. Continue to walk at home - you can still get blood clots when you are at home, so keep active, but don't over do it.  5. May return to work/school tomorrow or when you feel ready    SIGNS/SYMPTOMS TO CALL:  Common postoperative symptoms include urinary frequency, urgency, bladder spasm and blood in the urine  Please call us  if you have a fever greater than 101.5, uncontrolled nausea/vomiting, uncontrolled pain, dizziness, unable to urinate, excessively bloody urine, chest pain, shortness of breath, leg swelling, leg pain, or any other concerns or questions.   You can reach us  at [phone].   FOLLOW-UP:  1.  Due to ureteral anatomy ureter stone was unable to be treated as the scope was unable to be advanced into the kidney.  A stent has been placed and you will need to follow-up procedure in 2 weeks.  Melissa from our office will be calling you for a rescheduled date.

## 2024-02-07 NOTE — Op Note (Signed)
" ° °  Preoperative diagnosis:  Right nephrolithiasis   Postoperative diagnosis:  Right nephrolithiasis  Procedure:  Cystoscopy Right retrograde pyelogram with interpretation Attempted right ureteroscopy Right ureteral stent placement (78F/24 cm)   Surgeon: Glendia C. Tattiana Fakhouri, M.D.  Anesthesia: General  Complications: None  Intraoperative findings:  Cystoscopy: Bladder mucosa without solid or papillary lesions.  UOs normal-appearing bilaterally Ureteropyeloscopy: Right ureter very tight.  The ureteroscope was unable to be advanced beyond the midportion of the distal ureter Right retrograde pyelography demonstrated a filling defect within the renal pelvis consistent with the patients known calculus without other abnormalities.  EBL: Minimal  Specimens: None   Indication: Audrey Hurley is a 59 y.o. patient with a 15 mm right renal pelvic calculus who presents for ureteroscopic removal.  Refer to the admission H&P for details.  After reviewing the management options for treatment, the patient elected to proceed with the above surgical procedure(s). We have discussed the potential benefits and risks of the procedure, side effects of the proposed treatment, the likelihood of the patient achieving the goals of the procedure, and any potential problems that might occur during the procedure or recuperation. Informed consent has been obtained.  Description of procedure:  The patient was taken to the operating room and general anesthesia was induced.  The patient was placed in the dorsal lithotomy position, prepped and draped in the usual sterile fashion, and preoperative antibiotics were administered. A preoperative time-out was performed.   A 21 Fr cystoscope was lubricated, placed per urethra; panendoscopy was performed with findings described above.    Attention was directed to the right ureteral orifice and a 0.038 Sensor wire was then advanced up the  ureter into the renal  pelvis under fluoroscopic guidance.  The cystoscope was removed and a dual-lumen catheter was placed over the Sensor wire.  The catheter was advanced to the proximal ureter however the ureter was noted to be very tight.  5 mL of Omnipaque  contrast was gently instilled through the catheter and retrograde pyelogram was performed with findings as described above.  A Super Stiff wire was placed through the dual-lumen catheter followed by catheter removal.    A single channel digital flexible ureteroscope was placed over the Super Stiff wire however the scope would not advance beyond the midportion of the distal ureter.  The ureteroscope was removed.  An 8-10 French dilating catheter was unable to be advanced.  It was elected at this point to place a right ureteral stent to allow for passive dilation and follow-up ureteroscopy in ~2 weeks.  The Sensor wire was backloaded over the cystoscope and a 78F/24 cm Contour ureteral stent was advanced over the wire under fluoroscopic guidance.  The proximal stent tip was within an upper pole infundibulum with remaining curl in the renal pelvis.  The distal end stent was well-positioned in the bladder.  The bladder was then emptied and the procedure ended.  The patient appeared to tolerate the procedure well and without complications.  After anesthetic reversal the patient was transported to the PACU in stable condition.    Plan: Follow-up ureteroscopy/stone removal ~2 weeks   Glendia Barba, MD  "

## 2024-02-07 NOTE — Interval H&P Note (Signed)
 History and Physical Interval Note:  02/07/2024 7:08 AM  Audrey Hurley  has presented today for surgery, with the diagnosis of Right Nephrolithiasis.  The various methods of treatment have been discussed with the patient and family. After consideration of risks, benefits and other options for treatment, the patient has consented to  Procedures: CYSTOSCOPY/URETEROSCOPY/HOLMIUM LASER/STENT PLACEMENT (Right) as a surgical intervention.  The patient's history has been reviewed, patient examined, no change in status, stable for surgery.  I have reviewed the patient's chart and labs.  Questions were answered to the patient's satisfaction.    Dr. Kaaren note and CT reviewed.  [name] had no questions and desires to proceed.  CV: RRR Lungs: Clear   Ellsworth Waldschmidt C Harley Mccartney

## 2024-02-08 ENCOUNTER — Encounter: Payer: Self-pay | Admitting: Urology

## 2024-02-12 ENCOUNTER — Telehealth: Payer: Self-pay

## 2024-02-12 ENCOUNTER — Other Ambulatory Visit: Payer: Self-pay

## 2024-02-12 ENCOUNTER — Other Ambulatory Visit: Payer: Self-pay | Admitting: Urology

## 2024-02-12 ENCOUNTER — Encounter: Payer: Self-pay | Admitting: Urology

## 2024-02-12 DIAGNOSIS — N200 Calculus of kidney: Secondary | ICD-10-CM

## 2024-02-12 DIAGNOSIS — N201 Calculus of ureter: Secondary | ICD-10-CM

## 2024-02-12 NOTE — Progress Notes (Signed)
 Surgical Physician Order Form [facility]   Dr. Glendia Barba, MD  * Scheduling expectation : 02/21/2024 at the earliest   *Length of Case: 1 hour   *Clearance needed: no   *Anticoagulation Instructions: N/A   *Aspirin Instructions: N/A   *Post-op visit Date/Instructions:  1 month with RUS prior   *Diagnosis: Right Nephrolithiasis   *Procedure: right Ureteroscopy w/laser lithotripsy & stent placement (47643)     Additional orders: N/A   -Admit type: OUTpatient   -Anesthesia: General   -VTE Prophylaxis Standing Order SCD's       Other:    -Standing Lab Orders Per Anesthesia     Lab other: UA&Urine Culture   -Standing Test orders EKG/Chest x-ray per Anesthesia             Test other:    - Medications:  Ancef  2gm IV   -Other orders:  N/A

## 2024-02-12 NOTE — Telephone Encounter (Signed)
 Per Dr. Twylla, Patient is to be scheduled for Right Ureteroscopy with Laser Lithotripsy and Stent Exchange  Audrey Hurley was contacted and possible surgical dates were discussed, Thursday February 5th, 2026 was agreed upon for surgery.  Patient was directed to call [phone] between 1-3pm the day before surgery to find out surgical arrival time.  Instructions were given not to eat or drink from midnight on the night before surgery and have a driver for the day of surgery. On the surgery day patient was instructed to enter through the [facility] entrance of [facility] report the Same Day Surgery desk.   Pre-Admit Testing will be in contact via phone to set up an interview with the anesthesia team to review your history and medications prior to surgery.   Reminder of this information was sent via MyChart to the patient.

## 2024-02-12 NOTE — Telephone Encounter (Signed)
 Patient still in great deal of pain, has been taking oxybutynin  and Flomax  as prescribed. Wanted to know if you could send her in some additional Hydrocodone  to help with the flank pain. Pain radiates down to grown and around mid back. Please Advise.

## 2024-02-12 NOTE — Progress Notes (Signed)
" ° °  [facility] Surgical Posting Form  Surgery Date: Date: 02/21/2024  Surgeon: Dr. Glendia Barba, MD  Inpt ( No  )   Outpt (Yes)   Obs ( No  )   Diagnosis: N20.0 Right Nephrolithiasis  -CPT: [phone]  Surgery: Right Ureteroscopy with Laser Lithotripsy and Stent Exchange  Stop Anticoagulations: No  Cardiac/Medical/Pulmonary Clearance needed: nonw  *Orders entered into EPIC  Date: 02/12/24   *Case booked in [place]  Date: 02/12/24  *Notified pt of Surgery: Date: 02/12/24  PRE-OP UA & CX: no  *Placed into Prior Authorization Work [place] Date: 02/12/24  Assistant/laser/rep:No                "

## 2024-02-12 NOTE — Telephone Encounter (Signed)
 Patient called the triage line stating she had a stent placed on 01/22 and has completed her course of pain medication. Patient c/o abdominal pain radiating into the right thigh and buttock. Patient states she is taking Tylenol  and ibuprofen but continues to have pain. Pain is rated 5-6/10 when sitting and increases to 8/10 when standing. Patient reports she is taking Flomax  and oxybutynin  and is using a heating pad but continues to have significant pain. Please advise.

## 2024-02-13 MED ORDER — HYDROCODONE-ACETAMINOPHEN 5-325 MG PO TABS: 1.0000 | ORAL_TABLET | Freq: Four times a day (QID) | ORAL | 0 refills | Status: AC | PRN

## 2024-02-13 NOTE — Addendum Note (Signed)
 Addended by: TWYLLA GLENDIA BROCKS on: 02/13/2024 07:52 AM   Modules accepted: Orders

## 2024-02-14 ENCOUNTER — Telehealth: Payer: Self-pay | Admitting: Gastroenterology

## 2024-02-14 NOTE — Telephone Encounter (Signed)
 Incoming call from pt regarding scheduling appt. Pt scheduled for 02/03; requesting sooner visit due to upcoming snow. Please advise. Thank you.

## 2024-02-18 ENCOUNTER — Ambulatory Visit
Admission: RE | Admit: 2024-02-18 | Discharge: 2024-02-18 | Disposition: A | Discharge status: Home / Self Care | Source: Ambulatory Visit | Attending: Family Medicine | Admitting: Family Medicine

## 2024-02-18 ENCOUNTER — Encounter

## 2024-02-18 DIAGNOSIS — Z1231 Encounter for screening mammogram for malignant neoplasm of breast: Secondary | ICD-10-CM

## 2024-02-19 ENCOUNTER — Ambulatory Visit: Admitting: Gastroenterology

## 2024-02-19 ENCOUNTER — Encounter: Payer: Self-pay | Admitting: Gastroenterology

## 2024-02-19 ENCOUNTER — Other Ambulatory Visit (INDEPENDENT_AMBULATORY_CARE_PROVIDER_SITE_OTHER)

## 2024-02-19 VITALS — BP 102/60 | HR 74 | Ht 65.0 in | Wt 189.0 lb

## 2024-02-19 DIAGNOSIS — K5900 Constipation, unspecified: Secondary | ICD-10-CM

## 2024-02-19 DIAGNOSIS — Z860100 Personal history of colon polyps, unspecified: Secondary | ICD-10-CM | POA: Diagnosis not present

## 2024-02-19 DIAGNOSIS — D3A8 Other benign neuroendocrine tumors: Secondary | ICD-10-CM

## 2024-02-19 DIAGNOSIS — K581 Irritable bowel syndrome with constipation: Secondary | ICD-10-CM

## 2024-02-19 MED ORDER — LINACLOTIDE 72 MCG PO CAPS: 72.0000 ug | ORAL_CAPSULE | Freq: Every day | ORAL | 3 refills | Status: AC

## 2024-02-20 ENCOUNTER — Encounter

## 2024-02-21 ENCOUNTER — Other Ambulatory Visit: Payer: Self-pay

## 2024-02-21 ENCOUNTER — Other Ambulatory Visit: Payer: Self-pay | Admitting: Urology

## 2024-02-21 ENCOUNTER — Ambulatory Visit

## 2024-02-21 ENCOUNTER — Encounter: Admission: RE | Disposition: A | Payer: Self-pay | Source: Home / Self Care | Attending: Urology

## 2024-02-21 ENCOUNTER — Ambulatory Visit: Admitting: Anesthesiology

## 2024-02-21 ENCOUNTER — Encounter: Payer: Self-pay | Admitting: Urology

## 2024-02-21 ENCOUNTER — Ambulatory Visit
Admission: RE | Admit: 2024-02-21 | Discharge: 2024-02-21 | Disposition: A | Source: Home / Self Care | Attending: Urology | Admitting: Urology

## 2024-02-21 DIAGNOSIS — N200 Calculus of kidney: Secondary | ICD-10-CM | POA: Diagnosis not present

## 2024-02-21 LAB — GLUCOSE, CAPILLARY
Glucose-Capillary: 127 mg/dL — ABNORMAL HIGH (ref 70–99)
Glucose-Capillary: 127 mg/dL — ABNORMAL HIGH (ref 70–99)

## 2024-02-21 LAB — CHROMOGRANIN A: Chromogranin A (ng/mL): 114.7 ng/mL — ABNORMAL HIGH (ref 0.0–101.8)

## 2024-02-21 MED ORDER — OXYCODONE HCL 5 MG PO TABS
5.0000 mg | ORAL_TABLET | Freq: Once | ORAL | Status: AC | PRN
  Administered 2024-02-21: 5 mg via ORAL

## 2024-02-21 MED ORDER — DEXAMETHASONE SOD PHOSPHATE PF 10 MG/ML IJ SOLN
INTRAMUSCULAR | Status: DC | PRN
  Administered 2024-02-21: 4 mg via INTRAVENOUS

## 2024-02-21 MED ORDER — DEXAMETHASONE SOD PHOSPHATE PF 10 MG/ML IJ SOLN
INTRAMUSCULAR | Status: AC
  Filled 2024-02-21: qty 1

## 2024-02-21 MED ORDER — OXYBUTYNIN CHLORIDE 5 MG PO TABS: ORAL_TABLET | ORAL | 0 refills | Status: AC

## 2024-02-21 MED ORDER — FENTANYL CITRATE (PF) 100 MCG/2ML IJ SOLN
INTRAMUSCULAR | Status: DC | PRN
  Administered 2024-02-21 (×3): 50 ug via INTRAVENOUS

## 2024-02-21 MED ORDER — STERILE WATER FOR IRRIGATION IR SOLN
Status: DC | PRN
  Administered 2024-02-21: 500 mL

## 2024-02-21 MED ORDER — ORAL CARE MOUTH RINSE: 15.0000 mL | Freq: Once | OROMUCOSAL | Status: AC

## 2024-02-21 MED ORDER — ONDANSETRON HCL 4 MG/2ML IJ SOLN
INTRAMUSCULAR | Status: AC
  Filled 2024-02-21: qty 2

## 2024-02-21 MED ORDER — FENTANYL CITRATE (PF) 100 MCG/2ML IJ SOLN
INTRAMUSCULAR | Status: AC
  Filled 2024-02-21: qty 2

## 2024-02-21 MED ORDER — ONDANSETRON HCL 4 MG/2ML IJ SOLN
INTRAMUSCULAR | Status: DC | PRN
  Administered 2024-02-21: 4 mg via INTRAVENOUS

## 2024-02-21 MED ORDER — SODIUM CHLORIDE 0.9 % IV SOLN: INTRAVENOUS | Status: DC

## 2024-02-21 MED ORDER — OXYCODONE HCL 5 MG/5ML PO SOLN: 5.0000 mg | Freq: Once | ORAL | Status: AC | PRN

## 2024-02-21 MED ORDER — LIDOCAINE HCL (PF) 2 % IJ SOLN
INTRAMUSCULAR | Status: AC
  Filled 2024-02-21: qty 5

## 2024-02-21 MED ORDER — CHLORHEXIDINE GLUCONATE 0.12 % MT SOLN
15.0000 mL | Freq: Once | OROMUCOSAL | Status: AC
  Administered 2024-02-21: 15 mL via OROMUCOSAL

## 2024-02-21 MED ORDER — CEFAZOLIN SODIUM-DEXTROSE 2-4 GM/100ML-% IV SOLN
INTRAVENOUS | Status: AC
  Filled 2024-02-21: qty 100

## 2024-02-21 MED ORDER — SODIUM CHLORIDE 0.9 % IR SOLN
Status: DC | PRN
  Administered 2024-02-21: 1

## 2024-02-21 MED ORDER — MIDAZOLAM HCL (PF) 2 MG/2ML IJ SOLN
INTRAMUSCULAR | Status: DC | PRN
  Administered 2024-02-21: 2 mg via INTRAVENOUS

## 2024-02-21 MED ORDER — CEFAZOLIN SODIUM-DEXTROSE 2-4 GM/100ML-% IV SOLN
2.0000 g | INTRAVENOUS | Status: AC
  Administered 2024-02-21: 2 g via INTRAVENOUS

## 2024-02-21 MED ORDER — PROPOFOL 10 MG/ML IV BOLUS
INTRAVENOUS | Status: DC | PRN
  Administered 2024-02-21: 200 mg via INTRAVENOUS

## 2024-02-21 MED ORDER — ACETAMINOPHEN 10 MG/ML IV SOLN
INTRAVENOUS | Status: DC | PRN
  Administered 2024-02-21: 1000 mg via INTRAVENOUS

## 2024-02-21 MED ORDER — IOHEXOL 180 MG/ML  SOLN
INTRAMUSCULAR | Status: DC | PRN
  Administered 2024-02-21 (×2): 10 mL

## 2024-02-21 MED ORDER — OXYCODONE HCL 5 MG PO TABS
ORAL_TABLET | ORAL | Status: AC
  Filled 2024-02-21: qty 1

## 2024-02-21 MED ORDER — MIDAZOLAM HCL 2 MG/2ML IJ SOLN
INTRAMUSCULAR | Status: AC
  Filled 2024-02-21: qty 2

## 2024-02-21 MED ORDER — PROPOFOL 10 MG/ML IV BOLUS
INTRAVENOUS | Status: AC
  Filled 2024-02-21: qty 20

## 2024-02-21 MED ORDER — LIDOCAINE HCL (CARDIAC) PF 100 MG/5ML IV SOSY
PREFILLED_SYRINGE | INTRAVENOUS | Status: DC | PRN
  Administered 2024-02-21: 100 mg via INTRAVENOUS

## 2024-02-21 MED ORDER — ACETAMINOPHEN 10 MG/ML IV SOLN: 1000.0000 mg | Freq: Once | INTRAVENOUS | Status: DC | PRN

## 2024-02-21 MED ORDER — DROPERIDOL 2.5 MG/ML IJ SOLN: 0.6250 mg | Freq: Once | INTRAMUSCULAR | Status: DC | PRN

## 2024-02-21 MED ORDER — CHLORHEXIDINE GLUCONATE 0.12 % MT SOLN
OROMUCOSAL | Status: AC
  Filled 2024-02-21: qty 15

## 2024-02-21 MED ORDER — ACETAMINOPHEN 10 MG/ML IV SOLN
INTRAVENOUS | Status: AC
  Filled 2024-02-21: qty 100

## 2024-02-21 MED ORDER — FENTANYL CITRATE (PF) 100 MCG/2ML IJ SOLN: 25.0000 ug | INTRAMUSCULAR | Status: DC | PRN

## 2024-02-21 NOTE — Discharge Instructions (Addendum)
 DISCHARGE INSTRUCTIONS FOR KIDNEY STONE/URETERAL STENT   MEDICATIONS:  1. Resume all your other meds from home.  2.  AZO (over-the-counter) can help with the burning/stinging when you urinate. 3.  Continue pain and bladder medications as needed  ACTIVITY:  1. May resume regular activities in 24 hours. 2. No driving while on narcotic pain medications  3. Drink plenty of water   4. Continue to walk at home - you can still get blood clots when you are at home, so keep active, but don't over do it.  5. May return to work/school tomorrow or when you feel ready   BATHING:  1. You can shower. 2. You have a string coming from your urethra: The stent string is attached to your ureteral stent. Do not pull on this.   SIGNS/SYMPTOMS TO CALL:  Common postoperative symptoms include urinary frequency, urgency, bladder spasm and blood in the urine  Please call us  if you have a fever greater than 101.5, uncontrolled nausea/vomiting, uncontrolled pain, dizziness, unable to urinate, excessively bloody urine, chest pain, shortness of breath, leg swelling, leg pain, or any other concerns or questions.   You can reach us  at [phone].   FOLLOW-UP:  1. You will be contacted for a 1 month follow-up appointment 2. You have a string attached to your stent, which is tucked in the vagina.  You may remove it on Monday, 02/25/2024. To do this, pull the string until the stent is completely removed. You may feel an odd sensation in your back.  If you would prefer our office to remove the stent then contact the office and a time will be arranged for you to come in on Monday for stent removal  You received tylenol  today at 12:37, if needed please wait for six hours to repeat a dose.

## 2024-02-21 NOTE — Op Note (Signed)
 "  Preoperative diagnosis:  Right nephrolithiasis   Postoperative diagnosis:  Right nephrolithiasis  Procedure:  Cystoscopy Right ureteroscopy and stone removal Ureteroscopic laser lithotripsy Right ureteral stent exchange (16F/24 cm)  Right retrograde pyelography with interpretation  Surgeon: Glendia C. Rebeca Valdivia, M.D.  Anesthesia: General  Complications: None  Intraoperative findings:  Cystoscopy: Bladder mucosa without solid or papillary lesions. Inflammatory changes right UO secondary to indwelling stent. Ureteropyeloscopy: Fair amount of mucoid material in the renal pelvis and lower calyces initially obscuring vision.  Stone identified in a posterior midpole calyx Right retrograde pyelography did not demonstrate a filling defect within in the collecting system. Right retrograde pyelography post procedure showed no filling defects, stone fragments or contrast extravasation  EBL: Minimal  Specimens: Calculus fragments for analysis   Indication: Audrey Hurley is a 59 y.o. patient with a 15 mm right renal pelvic calculus.  Initial attempt at ureteroscopic removal 02/07/2024 was unsuccessful due to ureteral narrowing and inability to access the collecting system.  A ureteral stent was placed for passive stent dilation.  She presents today for definitive stone management.  Refer to the admission H&P for details.  After reviewing the management options for treatment, the patient elected to proceed with the above surgical procedure(s). We have discussed the potential benefits and risks of the procedure, side effects of the proposed treatment, the likelihood of the patient achieving the goals of the procedure, and any potential problems that might occur during the procedure or recuperation. Informed consent has been obtained.  Description of procedure:  The patient was taken to the operating room and general anesthesia was induced.  The patient was placed in the dorsal lithotomy  position, prepped and draped in the usual sterile fashion, and preoperative antibiotics were administered. A preoperative time-out was performed.   A 21 Fr cystoscope was lubricated, placed per urethra and advanced proximally into the bladder under direct vision.  Panendoscopy was performed with findings described above.    The indwelling stent was grasped with endoscopic forceps and brought out through the urethral meatus.  The stent was cannulated with a 0.038 Sensor wire which was advanced into the renal pelvis under fluoroscopic guidance.  A single channel digital flexible ureteroscope was placed per urethra and easily inserted into the right UO alongside the guidewire.  The scope was advanced proximally under direct vision into the renal pelvis.  There was a moderate amount of mucoid material in the renal pelvis and all calyces which was aspirated with a syringe to improve vision.  Retrograde pyelogram was then performed with findings described above.  All calyces were examined under fluoroscopic guidance and the calculus was identified in a midpole calyx..    A 365 m Moses holmium laser fiber was placed through the ureteroscope and the stone was dusted at a setting of 0.5 J/80 hz.  A few small fragments were placed in a 1.9 F0 tip nitinol basket, removed and sent for analysis.  The ureteroscope was repassed.  Repeat retrograde pyelogram was performed.  All calyces were examined under fluoroscopic guidance and no significantly sized stone fragments were identified.  The ureteroscope was removed under direct vision and no ureteral fragments were identified.  A 6 F/24 cm Contour ureteral stent w/ tether was placed under fluoroscopic guidance.  The wire was then removed with an adequate stent curl noted in the renal pelvis as well as in the bladder.  The bladder was then emptied and the procedure ended.  The patient appeared to tolerate the procedure  well and without complications.  After  anesthetic reversal the patient was transported to the PACU in stable condition.    Plan: Instructed to remove stent via the tether on Monday, 02/25/2024 Postop follow-up will be scheduled in ~1 month   Glendia Barba, MD  "

## 2024-02-21 NOTE — Transfer of Care (Signed)
 Immediate Anesthesia Transfer of Care Note  Patient: Audrey Hurley  Procedure(s) Performed: CYSTOSCOPY/URETEROSCOPY/HOLMIUM LASER/STENT PLACEMENT (Right: Ureter)  Patient Location: PACU  Anesthesia Type:General  Level of Consciousness: drowsy  Airway & Oxygen Therapy: Patient Spontanous Breathing and Patient connected to face mask oxygen  Post-op Assessment: Report given to RN, Post -op Vital signs reviewed and stable, and Patient moving all extremities X 4  Post vital signs: Reviewed and stable  Last Vitals:  Vitals Value Taken Time  BP 118/78   Temp    Pulse 70   Resp 12   SpO2 91     Last Pain:  Vitals:   02/21/24 0924  TempSrc: Temporal  PainSc: 0-No pain         Complications: No notable events documented.

## 2024-02-21 NOTE — Interval H&P Note (Signed)
 History and Physical Interval Note:  02/21/2024 11:08 AM  Audrey Hurley  has presented today for surgery, with the diagnosis of Right Nephrolithiasis.  The various methods of treatment have been discussed with the patient and family. After consideration of risks, benefits and other options for treatment, the patient has consented to  Procedures with comments: CYSTOSCOPY/URETEROSCOPY/HOLMIUM LASER/STENT PLACEMENT (Right) - EXCHANGE as a surgical intervention.  The patient's history has been reviewed, patient examined, no change in status, stable for surgery.  I have reviewed the patient's chart and labs.  Questions were answered to the patient's satisfaction.    Initial ureteroscopy 02/07/2024 unsuccessful secondary to ureteral anatomy-stent placed for passive dilation.  Presents today for follow-up ureteroscopy with laser lithotripsy/stone removal  CV: RRR Lungs: Clear   Jairo Bellew C Teyton Pattillo

## 2024-02-21 NOTE — Anesthesia Procedure Notes (Signed)
 Procedure Name: LMA Insertion Date/Time: 02/21/2024 12:05 PM  Performed by: Jaylene Nest, CRNAPre-anesthesia Checklist: Patient identified, Patient being monitored, Timeout performed, Emergency Drugs available and Suction available Patient Re-evaluated:Patient Re-evaluated prior to induction Oxygen Delivery Method: Circle system utilized Preoxygenation: Pre-oxygenation with 100% oxygen Induction Type: IV induction Ventilation: Mask ventilation without difficulty LMA: LMA inserted LMA Size: 4.0 Tube type: Oral Number of attempts: 1 Placement Confirmation: positive ETCO2 and breath sounds checked- equal and bilateral Tube secured with: Tape Dental Injury: Teeth and Oropharynx as per pre-operative assessment

## 2024-02-21 NOTE — Anesthesia Preprocedure Evaluation (Addendum)
"                                    Anesthesia Evaluation  Patient identified by MRN, date of birth, ID band Patient awake    Reviewed: Allergy & Precautions, H&P , NPO status , Patient's Chart, lab work & pertinent test results  Airway Mallampati: III  TM Distance: >3 FB Neck ROM: full    Dental no notable dental hx.    Pulmonary asthma (Moderate persistent) , sleep apnea    Pulmonary exam normal        Cardiovascular hypertension, Normal cardiovascular exam     Neuro/Psych  PSYCHIATRIC DISORDERS  Depression    negative neurological ROS     GI/Hepatic negative GI ROS, Neg liver ROS,,,  Endo/Other  diabetes, Well Controlled, Type 2    Renal/GU Renal disease     Musculoskeletal   Abdominal   Peds  Hematology negative hematology ROS (+)   Anesthesia Other Findings Past Medical History: No date: Allergy     Comment:  seasonal-takes zyrtec No date: Asthma No date: Depression No date: Diabetes mellitus     Comment:  taking metformin 05/03/2009: Fatty liver     Comment:  seen on CT No date: GERD (gastroesophageal reflux disease) No date: History of kidney stones No date: Hyperlipidemia No date: Hypertension No date: Kidney stones No date: Melanoma (HCC) No date: Pneumonia No date: Sleep apnea     Comment:  no cpap 05/03/2009: Ureteral calculus  Past Surgical History: 06/17/2007: BREAST BIOPSY; Right 01/16/1990: CESAREAN SECTION No date: COLONOSCOPY No date: DILATION AND CURETTAGE OF UTERUS No date: ENDOMETRIAL ABLATION W/ NOVASURE No date: ESOPHAGOGASTRODUODENOSCOPY     Comment:  x 2 01/16/2005: LAPAROSCOPIC HYSTERECTOMY     Comment:  with lysis of adhesions No date: Sling cystourethropexy No date: TUBAL LIGATION     Reproductive/Obstetrics negative OB ROS                              Anesthesia Physical Anesthesia Plan  ASA: 2  Anesthesia Plan: General   Post-op Pain Management: Toradol IV  (intra-op)* and Tylenol  PO (pre-op)*   Induction: Intravenous  PONV Risk Score and Plan: Dexamethasone, Ondansetron , Midazolam  and Treatment may vary due to age or medical condition  Airway Management Planned: Oral ETT and LMA  Additional Equipment:   Intra-op Plan:   Post-operative Plan: Extubation in OR  Informed Consent: I have reviewed the patients History and Physical, chart, labs and discussed the procedure including the risks, benefits and alternatives for the proposed anesthesia with the patient or authorized representative who has indicated his/her understanding and acceptance.     Dental Advisory Given  Plan Discussed with: Anesthesiologist, CRNA and Surgeon  Anesthesia Plan Comments:          Anesthesia Quick Evaluation  "

## 2024-02-21 NOTE — Addendum Note (Signed)
 Addendum  created 02/21/24 0828 by Vicci Camellia Glatter, MD   Attestation recorded in Intraprocedure, Intraprocedure Attestations filed

## 2024-02-22 ENCOUNTER — Encounter: Payer: Self-pay | Admitting: Urology

## 2024-02-22 ENCOUNTER — Ambulatory Visit: Payer: Self-pay | Admitting: Gastroenterology

## 2024-03-25 ENCOUNTER — Ambulatory Visit: Admitting: Urology

## 2024-07-28 ENCOUNTER — Ambulatory Visit: Admitting: Urology
